# Patient Record
Sex: Female | Born: 1935
Health system: Southern US, Community
[De-identification: ages and names within clinical notes are randomized; demographics above are authoritative.]

## PROBLEM LIST (undated history)

## (undated) DIAGNOSIS — E7800 Pure hypercholesterolemia, unspecified: Secondary | ICD-10-CM

## (undated) DIAGNOSIS — N189 Chronic kidney disease, unspecified: Secondary | ICD-10-CM

## (undated) DIAGNOSIS — N289 Disorder of kidney and ureter, unspecified: Secondary | ICD-10-CM

## (undated) DIAGNOSIS — K219 Gastro-esophageal reflux disease without esophagitis: Secondary | ICD-10-CM

## (undated) DIAGNOSIS — E119 Type 2 diabetes mellitus without complications: Secondary | ICD-10-CM

## (undated) DIAGNOSIS — I10 Essential (primary) hypertension: Secondary | ICD-10-CM

## (undated) HISTORY — PX: ABDOMINAL HYSTERECTOMY: SHX81

## (undated) HISTORY — DX: Type 2 diabetes mellitus without complications: E11.9

## (undated) HISTORY — PX: CHOLECYSTECTOMY: SHX55

## (undated) HISTORY — DX: Gastro-esophageal reflux disease without esophagitis: K21.9

## (undated) HISTORY — DX: Disorder of kidney and ureter, unspecified: N28.9

## (undated) HISTORY — DX: Chronic kidney disease, unspecified: N18.9

## (undated) HISTORY — PX: CATARACT EXTRACTION: SUR2

## (undated) HISTORY — DX: Pure hypercholesterolemia, unspecified: E78.00

## (undated) HISTORY — DX: Essential (primary) hypertension: I10

---

## 1998-12-06 ENCOUNTER — Encounter: Admission: RE | Admit: 1998-12-06 | Discharge: 1999-03-06 | Payer: Self-pay | Admitting: Internal Medicine

## 2002-04-02 ENCOUNTER — Ambulatory Visit (HOSPITAL_COMMUNITY): Admission: RE | Admit: 2002-04-02 | Discharge: 2002-04-02 | Payer: Self-pay | Admitting: Internal Medicine

## 2002-04-02 ENCOUNTER — Encounter: Payer: Self-pay | Admitting: Internal Medicine

## 2002-05-13 ENCOUNTER — Encounter (HOSPITAL_COMMUNITY): Admission: RE | Admit: 2002-05-13 | Discharge: 2002-06-12 | Payer: Self-pay | Admitting: Internal Medicine

## 2002-05-13 ENCOUNTER — Encounter: Payer: Self-pay | Admitting: Internal Medicine

## 2002-05-14 ENCOUNTER — Encounter: Payer: Self-pay | Admitting: Internal Medicine

## 2003-01-23 HISTORY — PX: COLONOSCOPY: SHX174

## 2003-03-15 ENCOUNTER — Ambulatory Visit (HOSPITAL_COMMUNITY): Admission: RE | Admit: 2003-03-15 | Discharge: 2003-03-15 | Payer: Self-pay | Admitting: Ophthalmology

## 2003-08-23 ENCOUNTER — Ambulatory Visit (HOSPITAL_COMMUNITY): Admission: RE | Admit: 2003-08-23 | Discharge: 2003-08-23 | Payer: Self-pay | Admitting: Ophthalmology

## 2003-10-20 ENCOUNTER — Ambulatory Visit (HOSPITAL_COMMUNITY): Admission: RE | Admit: 2003-10-20 | Discharge: 2003-10-20 | Payer: Self-pay | Admitting: Internal Medicine

## 2004-05-22 ENCOUNTER — Ambulatory Visit (HOSPITAL_COMMUNITY): Admission: RE | Admit: 2004-05-22 | Discharge: 2004-05-22 | Payer: Self-pay | Admitting: Ophthalmology

## 2004-11-20 ENCOUNTER — Ambulatory Visit (HOSPITAL_COMMUNITY): Admission: RE | Admit: 2004-11-20 | Discharge: 2004-11-20 | Payer: Self-pay | Admitting: Internal Medicine

## 2005-11-02 ENCOUNTER — Ambulatory Visit (HOSPITAL_COMMUNITY): Admission: RE | Admit: 2005-11-02 | Discharge: 2005-11-02 | Payer: Self-pay | Admitting: Internal Medicine

## 2006-08-30 ENCOUNTER — Ambulatory Visit (HOSPITAL_COMMUNITY): Admission: RE | Admit: 2006-08-30 | Discharge: 2006-08-30 | Payer: Self-pay | Admitting: Internal Medicine

## 2006-09-30 ENCOUNTER — Ambulatory Visit (HOSPITAL_COMMUNITY): Admission: RE | Admit: 2006-09-30 | Discharge: 2006-09-30 | Payer: Self-pay | Admitting: Internal Medicine

## 2006-10-09 ENCOUNTER — Ambulatory Visit: Payer: Self-pay | Admitting: Internal Medicine

## 2006-10-22 ENCOUNTER — Ambulatory Visit: Payer: Self-pay | Admitting: Internal Medicine

## 2006-10-22 ENCOUNTER — Ambulatory Visit (HOSPITAL_COMMUNITY): Admission: RE | Admit: 2006-10-22 | Discharge: 2006-10-22 | Payer: Self-pay | Admitting: Internal Medicine

## 2006-10-22 HISTORY — PX: ESOPHAGOGASTRODUODENOSCOPY: SHX1529

## 2006-11-21 ENCOUNTER — Ambulatory Visit: Payer: Self-pay | Admitting: Internal Medicine

## 2007-02-17 ENCOUNTER — Ambulatory Visit (HOSPITAL_COMMUNITY): Admission: RE | Admit: 2007-02-17 | Discharge: 2007-02-17 | Payer: Self-pay | Admitting: Internal Medicine

## 2007-02-20 ENCOUNTER — Ambulatory Visit: Payer: Self-pay | Admitting: Internal Medicine

## 2007-10-06 DIAGNOSIS — R630 Anorexia: Secondary | ICD-10-CM

## 2007-10-06 DIAGNOSIS — K819 Cholecystitis, unspecified: Secondary | ICD-10-CM | POA: Insufficient documentation

## 2007-10-06 DIAGNOSIS — E119 Type 2 diabetes mellitus without complications: Secondary | ICD-10-CM | POA: Insufficient documentation

## 2007-10-06 DIAGNOSIS — R634 Abnormal weight loss: Secondary | ICD-10-CM

## 2007-10-06 DIAGNOSIS — B0229 Other postherpetic nervous system involvement: Secondary | ICD-10-CM | POA: Insufficient documentation

## 2007-10-06 DIAGNOSIS — R198 Other specified symptoms and signs involving the digestive system and abdomen: Secondary | ICD-10-CM | POA: Insufficient documentation

## 2007-10-06 DIAGNOSIS — N390 Urinary tract infection, site not specified: Secondary | ICD-10-CM | POA: Insufficient documentation

## 2007-10-06 DIAGNOSIS — E785 Hyperlipidemia, unspecified: Secondary | ICD-10-CM | POA: Insufficient documentation

## 2007-10-06 DIAGNOSIS — I10 Essential (primary) hypertension: Secondary | ICD-10-CM

## 2007-10-06 DIAGNOSIS — K5730 Diverticulosis of large intestine without perforation or abscess without bleeding: Secondary | ICD-10-CM | POA: Insufficient documentation

## 2007-10-06 DIAGNOSIS — R110 Nausea: Secondary | ICD-10-CM | POA: Insufficient documentation

## 2007-10-06 DIAGNOSIS — K644 Residual hemorrhoidal skin tags: Secondary | ICD-10-CM | POA: Insufficient documentation

## 2007-10-06 DIAGNOSIS — K449 Diaphragmatic hernia without obstruction or gangrene: Secondary | ICD-10-CM | POA: Insufficient documentation

## 2007-10-06 DIAGNOSIS — N289 Disorder of kidney and ureter, unspecified: Secondary | ICD-10-CM | POA: Insufficient documentation

## 2009-02-08 ENCOUNTER — Encounter: Payer: Self-pay | Admitting: Gastroenterology

## 2009-02-11 ENCOUNTER — Encounter: Payer: Self-pay | Admitting: Gastroenterology

## 2009-02-14 ENCOUNTER — Encounter: Payer: Self-pay | Admitting: Urgent Care

## 2009-10-18 ENCOUNTER — Ambulatory Visit (HOSPITAL_COMMUNITY): Admission: RE | Admit: 2009-10-18 | Discharge: 2009-10-18 | Payer: Self-pay | Admitting: Internal Medicine

## 2010-02-14 ENCOUNTER — Encounter: Payer: Self-pay | Admitting: Gastroenterology

## 2010-02-17 ENCOUNTER — Encounter (INDEPENDENT_AMBULATORY_CARE_PROVIDER_SITE_OTHER): Payer: Self-pay | Admitting: *Deleted

## 2010-02-21 NOTE — Medication Information (Signed)
Summary: Visual merchandiser   Imported By: Zeb Comfort 02/14/2009 09:06:45  _____________________________________________________________________  External Attachment:    Type:   Image     Comment:   External Document  Appended Document: RX Folder triplicate.  11 RFs sent on 02/09/09

## 2010-02-21 NOTE — Medication Information (Signed)
Summary: Visual merchandiser   Imported By: Zeb Comfort 02/08/2009 10:48:13  _____________________________________________________________________  External Attachment:    Type:   Image     Comment:   External Document  Appended Document: RX Folder - omeprazole    Prescriptions: OMEPRAZOLE 20 MG CPDR (OMEPRAZOLE) one by mouth daily 30 minutes before breakfast  #30 x 11   Entered and Authorized by:   Laureen Ochs. Bernarda Caffey   Signed by:   Laureen Ochs Latarshia Jersey PA-C on 02/09/2009   Method used:   [facility] to        [facility] [phone]* (retail)       [address]       [place], [place]  [address]       Ph: [phone] or [phone]       Fax: [phone]   RxID:   [id number]

## 2010-02-21 NOTE — Medication Information (Signed)
Summary: Visual merchandiser   Imported By: Zeb Comfort 02/11/2009 11:47:56  _____________________________________________________________________  External Attachment:    Type:   Image     Comment:   External Document  Appended Document: RX Folder duplicate

## 2010-02-22 ENCOUNTER — Encounter: Payer: Self-pay | Admitting: Gastroenterology

## 2010-02-22 ENCOUNTER — Telehealth (INDEPENDENT_AMBULATORY_CARE_PROVIDER_SITE_OTHER): Payer: Self-pay | Admitting: *Deleted

## 2010-02-23 NOTE — Medication Information (Signed)
Summary: OMEPRAZOLE 20MG   OMEPRAZOLE 20MG    Imported By: Hoy Morn 02/14/2010 14:52:05  _____________________________________________________________________  External Attachment:    Type:   Image     Comment:   External Document  Appended Document: OMEPRAZOLE 20MG  needs OV prior to more refills. see 1/24 note.   Appended Document: OMEPRAZOLE 20MG  pharmacy aware  Appended Document: OMEPRAZOLE 20MG  letter mailed to pt to call and set up OV

## 2010-02-23 NOTE — Letter (Signed)
Summary: Recall Office Visit  [facility]  [address]   [place], [place] [address]   Phone: [phone]  Fax: [phone]      February 17, 2010   [facility] [place], [place]  [address] October 09, 1933   Dear Ms. Antu,   According to our records, it is time for you to schedule a follow-up office visit with [place].   At your convenience, please call [phone] to schedule an office visit. If you have any questions, concerns, or feel that this letter is in error, we would appreciate your call.   Sincerely,    [facility] Ph: [phone]   Fax: [phone]

## 2010-03-01 NOTE — Medication Information (Signed)
Summary: OMEPRAZOLE DR 20MG   OMEPRAZOLE DR 20MG    Imported By: Hoy Morn 02/22/2010 09:52:00  _____________________________________________________________________  External Attachment:    Type:   Image     Comment:   External Document  Appended Document: OMEPRAZOLE DR 20MG     Prescriptions: OMEPRAZOLE 20 MG CPDR (OMEPRAZOLE) one by mouth daily 30 minutes before breakfast  #30 x 0   Entered and Authorized by:   Laban Emperor NP   Signed by:   Laban Emperor NP on 02/22/2010   Method used:   [facility] ...       [facility]  [address] [phone]* (retail)       [address]       [place], [place]  [address]       Ph: [phone]       Fax: [phone]   RxID:   [id number]

## 2010-03-01 NOTE — Progress Notes (Signed)
  Phone Note Call from Patient   Summary of Call: Pt called to make her appt for Rx refill- She is scheduled to see Vicente Males on 03/15/10 but wants to know can we give her enough Omerazole until then. [facility] [place]. Initial call taken by: Sherry Ruffing,  February 22, 2010 9:58 AM     Appended Document:  refill completed X 1. will be seen 2/22.   Appended Document:  pt aware

## 2010-03-15 ENCOUNTER — Encounter: Payer: Self-pay | Admitting: Gastroenterology

## 2010-03-15 ENCOUNTER — Ambulatory Visit (INDEPENDENT_AMBULATORY_CARE_PROVIDER_SITE_OTHER): Payer: Medicare Other | Admitting: Gastroenterology

## 2010-03-15 DIAGNOSIS — K219 Gastro-esophageal reflux disease without esophagitis: Secondary | ICD-10-CM

## 2010-03-21 NOTE — Assessment & Plan Note (Signed)
Summary: needs to be seen before getting meds   Vital Signs:  Patient profile:   77 year old female Height:      62.5 inches Weight:      140 pounds BMI:     25.29 Temp:     98 degrees F oral Pulse rate:   56 / minute BP sitting:   130 / 62  (left arm)  Vitals Entered By: Loney Loh LPN (February 22, [id number] 10:52 AM)  Visit Type:  Follow-up Visit Primary Care Provider:  Dr. Willey Blade   History of Present Illness: Presents in f/u for hx of GERD, epigastric pain. Taking Omeprazole daily; notices a difference if she doesn't take it. Otherwise, no breakthrough reflux. No dysphagia/odynophagia. No loss of appetite, no wt loss. No abdominal pain. BM daily, no melena, hematochezia. Doing well, just needs refill on medication.   Current Medications (verified): 1)  Glimepiride 2 Mg Tabs (Glimepiride) .... Take One Half Once Daily 2)  Lipitor 20 Mg Tabs (Atorvastatin Calcium) .... Once Daily 3)  Cozaar 100 Mg Tabs (Losartan Potassium) .... 1/2 Tablet Every Day. 4)  Metformin Hcl 500 Mg Tabs (Metformin Hcl) .... Take One Two Times A Day 5)  Atenolol-Chlorthalidone 50-25 Mg Tabs (Atenolol-Chlorthalidone) .... Once Daily 6)  Aspirin 81 Mg Tabs (Aspirin) .... Once Daily 7)  Omeprazole 20 Mg Cpdr (Omeprazole) .... One By Mouth Daily 30 Minutes Before Breakfast 8)  Omeprazole 20 Mg Cpdr (Omeprazole) .... Take 1 By Mouth 30 Minutes Before Breakfast  Allergies: 1)  ! Sulfa 2)  ! Codeine  Past History:  Past Medical History: GERD HTN DM Hypercholesterolemia  Review of Systems General:  Denies fever, chills, and anorexia. Eyes:  Denies blurring, irritation, and discharge. ENT:  Denies sore throat, hoarseness, and difficulty swallowing. CV:  Denies chest pains and syncope. Resp:  Denies dyspnea at rest and wheezing. GI:  See HPI. GU:  Denies urinary burning and urinary frequency. MS:  Denies joint pain / LOM, joint swelling, and joint stiffness. Derm:  Denies rash, itching, and dry  skin. Neuro:  Denies weakness and syncope. Psych:  Denies depression and anxiety.  Physical Exam  General:  Well developed, well nourished, no acute distress. Head:  Normocephalic and atraumatic. Abdomen:  +BS, soft, non-tender, non-distended. no HSM. no rebound or guarding. no masses noted.  Msk:  Symmetrical with no gross deformities. Normal posture. Pulses:  Normal pulses noted. Extremities:  No clubbing, cyanosis, edema or deformities noted. Neurologic:  Alert and  oriented x4;  grossly normal neurologically. Skin:  Intact without significant lesions or rashes. Psych:  Alert and cooperative. Normal mood and affect.   Impression & Recommendations:  Problem # 1:  GERD (ICD-3.45)  77 year old Caucasian female with hx of GERD, epigastric pain, doing well. Taking prilosec daily. No dysphagia or odynophagia. No lack of appetite.   Continue Prilosec daily F/U in 1 year or sooner if needed Refills sent to [facility]   Orders: Est. Patient Level II [id number]) Prescriptions: OMEPRAZOLE 20 MG CPDR (OMEPRAZOLE) take 1 by mouth 30 minutes before breakfast  #31 x 11   Entered and Authorized by:   Laban Emperor NP   Signed by:   Laban Emperor NP on 03/15/2010   Method used:   Faxed to ...       [facility]  [address] (retail)       [place], [place]  [address]  Ph: [phone]       Fax: [phone]   [id number]    Orders Added: 1)  Est. Patient Level II [id number]  Appended Document: needs to be seen before getting meds 1 YR F/U OPV IS IN THE COMPUTER

## 2010-06-06 NOTE — Assessment & Plan Note (Signed)
Tammy Dyer, Tammy Dyer               CHART#:  [id number]   DATE:  11/21/2006                       DOB:  1933-04-24   Follow up nausea, early satiety, epigastric pain, recent weight loss.  Last seen by me on 10/22/2006, at which time she underwent an EGD  revealing normal esophagus, small hiatal hernia, otherwise remainder  upper GI appeared normal. She had already experienced dramatic  improvement on less than two weeks worth of Prilosec 20 mg orally daily.  She is here with her daughter reporting that improvement has been  sustained. She has continued on Prilosec 20 mg orally daily. Nausea  __________ epigastric pain have subsided. She reportedly lost a good 16  or so pounds over the past couple of months and since she was seen by [place]  on 10/09/2006, not only has her weight has stabilized, but she has gained  1 pound. She has not had any melena or rectal bleeding. She did have a  colonoscopy with Dr. Shonna Chock back in September 2005 which revealed only  external hemorrhoids and left sided diverticula.   CURRENT MEDICATIONS:  See the updated list.   ALLERGIES:  SULFA and PENICILLIN.   This lady had previously undergone a CT of the abdomen and pelvis,  subsequent small bowel follow-through with questionable small bowel  loops. Ultimately, she had no significant findings or at least any  explanation for recent abdominal pain based on radiographic studies.   PHYSICAL EXAMINATION:  GENERAL:  She looks well.  VITAL SIGNS:  Weight 150, height 5 foot 4 inches, temperature 98.0,  blood pressure 128/70, pulse 64.  SKIN:  Warm and dry.  LUNGS:  Clear to auscultation.  CARDIAC:  Regular rate and rhythm with no murmurs, rubs, or gallops.  ABDOMEN:  Nondistended, positive bowel sounds, entirely soft, nontender,  without appreciable mass or organomegaly.   ASSESSMENT:  Epigastric pain, nausea, element of early satiety, much  improved on acid suppression therapy. She is diabetic and it is  certainly  possible that an element of diabetic gastroparesis is  __________.  However, she has experienced a near resolution of her  symptoms with acid suppression alone, of course gastroesophageal reflux  disease could be worsened with delay and gastric emptying.   RECOMMENDATIONS:  I have recommended that Ms. Cappucci simply stay on  Prilosec 20 mg orally daily for at least the next 3 months (prescription  given). We will plan on seeing this nice  lady back in three months to check her weight and see how she is doing  and we will go from there.       Bridgette Habermann, M.D.  Electronically Signed     RMR/MEDQ  D:  11/21/2006  T:  11/22/2006  Job:  [id number]   cc:   Paula Compton. Willey Blade, MD

## 2010-06-06 NOTE — Assessment & Plan Note (Signed)
NAMEKARNEISHA, Tammy Dyer               CHART#:  [id number]   DATE:  02/20/2007                       DOB:  [birth year]   Follow up epigastric pain, early satiety, weight loss, nausea, history  of diabetes.  Last seen 11/21/06.  Her epigastric pain is pretty much  gone.  She does have early satiety anorexic and some background symptoms  of nausea.  She certainly complains of early satiety.  Reflux symptoms  and abdominal pains have been abolished on Prilosec 20 mg orally daily.  She is a longstanding diabetic.  She is not having any diarrhea,  constipation, melena, or hematochezia.  She is having 1 formed bowel  movement daily.  She has lost 3 pounds since she was seen on 11/21/06.  A prior EGD demonstrated only a small hiatal hernia.  A colonoscopy in  2005 by Dr. Collins Scotland demonstrated some hemorrhoids and diverticulosis.   In 2008, she did have a small bowel follow-through which revealed  essentially a normal appearing small intestine.  A CT of the abdomen  back in August 2008 demonstrated some questionable small bowel full-  thickening proximally which led to a small bowel follow-through.   PAST MEDICAL HISTORY:  1. Diabetes.  2. Hypertension.  3. Chronic renal insufficiency.  4. Recurrent urinary tract infections.  5. Postherpetic neuralgia.   FAMILY HISTORY:  Negative for GI, neoplasia, inflammatory back disease.   CURRENT MEDICATIONS:  See updated list.   ALLERGIES:  Sulfa.  Codeine.   PHYSICAL EXAMINATION:  GENERAL:  She is accompanied by her daughter.  She appears comfortable.  VITAL SIGNS:  Weight 147, again down 3 pounds.  Height 5 feet 4 inches.  Temperature 97.8.  Blood pressure 108/70.  Pulse 68.  SKIN:  Warm and dry.  There is no jaundice.  HEENT:  No scleral icterus.  Conjunctivae are pink.  CHEST:  Lungs are clear to auscultation.  HEART:  Regular rate and rhythm without murmur, gallop, or rub.  BREAST EXAM:  Deferred.  ABDOMEN:  Nondistended.  Positive bowel  sounds.  No succussion splash.  Abdomen soft and nontender.  No appreciable mass or organomegaly.  EXTREMITIES:  No edema.   ASSESSMENT:  Early satiety anorexia along with weight loss in a  diabetic.  She has been fairly extensively evaluated up to this point.  Her reflux symptoms and abdominal pain component, her recent  symptomatology have resolved.  At this point, I would be more concerned  about the possibility of diabetic gastroparesis.  I told Tammy Dyer and  her daughter we ought to go ahead and get a baseline solid phase gastric  emptying study before an empiric trial of prokinetic therapy.  [name] wants to wait.  She says she has got a lot of things going at  this time.  So, at this point, I have recommended Tammy Dyer just  continue taking the Prilosec 20 mg orally daily and see how it goes.  It  her symptoms may settle down on their own but I would go with a GES as a  next step in her workup should she want to pursue things further.  At  any rate, I will plan to see Tammy Dyer back in 3 months.       Bridgette Habermann, M.D.  Electronically Signed     RMR/MEDQ  D:  02/20/2007  T:  02/20/2007  Job:  [id number]

## 2010-06-06 NOTE — Consult Note (Signed)
NAME:  Tammy Dyer, Tammy Dyer              ACCOUNT NO.:  [id number]   MEDICAL RECORD NO.:  [id number]          PATIENT TYPE:  AMB   LOCATION:  DAY                           FACILITY:  [facility]   PHYSICIAN:  R. Garfield Cornea, M.D. DATE OF BIRTH:  [birth year]   DATE OF CONSULTATION:  10/09/2006  DATE OF DISCHARGE:                                 CONSULTATION   REQUESTING PHYSICIAN:  Paula Compton. Willey Blade, MD.   REASON FOR CONSULTATION:  Epigastric pain, chronic nausea, weight loss,  anorexia, and early satiety.   HISTORY OF PRESENT ILLNESS:  Tammy Dyer is a 77 year old Caucasian  female.  She tells me that for a couple of months now she has had  frequent heartburn.  She has had worsening symptoms over the last 6  weeks including heartburn, chronic nausea where she feels sick solid  time.  She has lost 16 pounds in the last 2 months.  She tells me  everything I eat makes me feel sick.  She complains of epigastric  pain.  She has tried over-the-counter Tums and Pepcid AC both of which  seem to help a little.  She denies any dysphagia or odynophagia.  She  does complain of anorexia and early satiety.  She tells me everything  feels heavy on my stomach.  She is having normal bowel movements on a  daily basis.  Denies any rectal bleeding or melena.  Denies any diarrhea  or constipation.  She has had an abdominal/pelvic CT with contrast on  August 30, 2006.  She was found to have possible proximal small bowel  fold thickening versus incomplete distention.  She had a small bowel  follow through which was normal except for mild hypermotility.  She had  vascular calcifications with stable probable small splenic artery  aneurysm, and stable sigmoid diverticulosis.  She has a history of  chronic renal insufficiency with creatinine 1.5, BUN of 30 on August 21, 2006.  She had an ALT of 20, a hemoglobin A1c of 6.1.  She has not had a  complete set of LFT's.   PAST MEDICAL AND SURGICAL HISTORY:  Diabetes mellitus,  hypertension,  chronic renal insufficiency, urinary tract infection, currently on  Cipro, post hepatic neuralgia.  She had a colonoscopy by Dr. Laural Golden on  October 20, 2003 where she was found to have left colonic  diverticulosis as well as external hemorrhoids.  She has had a complete  hysterectomy, cholecystectomy in 1990 due to cholecystitis, and cataract  surgeries.   CURRENT MEDICATIONS:  1. Cipro 250 b.i.d. x7 days.  2. Glimepiride 2 mg daily.  3. Lipitor 12 grams daily.  4. Cozaar 50 mg daily.  5. Metformin HCl 500 mg b.i.d.  6. Atenolol Chlorthal 50/25 mg daily.  7. Aspirin 81 mg daily.   ALLERGIES:  SULFA and CODEINE.   FAMILY HISTORY:  There is no known family history of carcinoma or  chronic GI problems.  Mother deceased at 62 due to old age.  Father  deceased at age 68 secondary to CHF.  She has one sister with  Alzheimer's, two brothers have passed  away, with one brother living.   SOCIAL HISTORY:  Ms. Wolfenden is married.  She has two healthy children.  She is retired from a [employer].  She denies any tobacco, alcohol,  or drug use.   REVIEW OF SYSTEMS:  See HPI.  CONSTITUTIONAL:  Denies any fever or  chills.  CARDIOVASCULAR:  Denies any chest pain or palpitations,  __________ , shortness of breath, dyspnea, cough or hemoptysis.  GI:  See HPI.   PHYSICAL EXAMINATION:  VITAL SIGNS:  Weight 149 pounds, height 5 feet 3  inches.  Temperature 97.9, blood pressure 122/76, and pulse 60.  GENERAL:  Tammy Dyer is an elderly Caucasian female who is alert,  oriented, pleasant, and cooperative in no acute distress.  HEENT:  Skull is clear.  Sclerae are nonicteric.  Conjunctivae pink.  Oropharynx pink and moist without any lesions.  NECK:  Supple without __________  or thyromegaly.  CHEST:  Heart regular rhythm.  Normal S1-S2.  Without murmurs, clicks,  rubs or gallops.  ABDOMEN:  Positive bowel sounds x4.  No bruits auscultated.  Soft,  nondistended.  She does have  mild tenderness to the epigastrium and  around the umbilicus.  There is no rebound tenderness or guarding.  No  hepatosplenomegaly or mass.  EXTREMITIES:  Without clubbing or edema bilaterally.  SKIN:  Pink, warm and dry without any rash or jaundice.   IMPRESSION:  Tammy Dyer is a 77 year old Caucasian female with a 6-week  history of worsening heartburn, chronic nausea, early satiety, and  epigastric pain.  She is going to require further evaluation to rule out  peptic ulcer disease.  She has had a significant weight loss as well.  We also need to rule out occult malignancy in this lady given a 16-pound  weight loss in the last 2 months.  She does seem to have reflux and it  is possible that she could have diabetic gastroparesis;  however, this  does not explain her pain.   PLAN:  1. She is given a prescription for omeprazole 20 mg daily No. 31 with      2 refills as well as Prilosec 20 mg daily No. 10 samples.  2. An EGD with Dr. Gala Romney, in the near future.  I have discussed the      procedure including the risks and benefits; to      include but not limited to, bleeding, infection, perforation, and      drug reaction.  She agrees and an informed consent was obtained.   I would like to thank Dr. Willey Blade for allowing [place] to participate in the  care of Tammy Dyer.      Vickey Huger, N.P.      Bridgette Habermann, M.D.  Electronically Signed    KJ/MEDQ  D:  10/09/2006  T:  10/09/2006  Job:  [id number]   cc:   Paula Compton. Willey Blade, MD  Fax: [phone]

## 2010-06-06 NOTE — Op Note (Signed)
NAME:  Tammy Dyer, Tammy Dyer              ACCOUNT NO.:  [id number]   MEDICAL RECORD NO.:  [id number]          PATIENT TYPE:  AMB   LOCATION:  DAY                           FACILITY:  [facility]   PHYSICIAN:  R. Garfield Cornea, M.D. DATE OF BIRTH:  [birth year]   DATE OF PROCEDURE:  10/22/2006  DATE OF DISCHARGE:                               OPERATIVE REPORT   PROCEDURE:  Diagnostic EGD.   INDICATIONS FOR PROCEDURE:  A 77 year old lady with a six-week history  of worsening heartburn, chronic nausea, early satiety, epigastric pain.  She has history diabetes.  She was started on Cipro 20 mg orally daily  in the office on October 09, 2006.  This has been associated with a  marked dramatic improvement in the above-mentioned symptoms.  EGD is now  being done.  This approach has been discussed with the patient at  length.  Potential risks, benefits and alternatives have been reviewed,  questions answered.  She is agreeable.  Please see documentation in the  medical record.   PROCEDURE NOTE:  O2 saturation, blood pressure, pulse and respirations  were monitored throughout the entire procedure.   CONSCIOUS SEDATION:  Versed 3 mg IV, Demerol 75 mg IV in divided doses.   INSTRUMENT:  Pentax video chip system.   FINDINGS:  Examination of tubular esophagus revealed normal mucosa.  The  EG junction easily traversed.   Stomach:  Gastric cavity was emptied and insufflated well with air.  Thorough examination of the gastric mucosa including retroflexion to  view proximal stomach, esophagogastric junction demonstrated only a  small hiatal hernia.  Pylorus was patent and easily traversed.  Examination of the bulb and second portion revealed no abnormalities.   THERAPEUTIC/DIAGNOSTIC MANEUVERS PERFORMED:  None.   The patient tolerated the procedure well as reactive to endoscopy.   IMPRESSION:  Normal esophagus.  Small hiatal hernia, otherwise normal  stomach, D1 and D2.   This lady has experienced marked  dramatic improvement in the above-  mentioned symptoms with acid suppression therapy.   RECOMMENDATIONS:  Continue Prilosec 20 grams orally daily.  Will see  this nice lady back in one month.  Will check her weight, see how she is  doing and go from there.  Will decide about further evaluation depending  on how she does over the next four weeks.      Bridgette Habermann, M.D.  Electronically Signed     RMR/MEDQ  D:  10/22/2006  T:  10/22/2006  Job:  [id number]

## 2010-06-09 NOTE — Op Note (Signed)
Tammy Dyer, Tammy Dyer              ACCOUNT NO.:  [id number]   MEDICAL RECORD NO.:  [id number]          PATIENT TYPE:  AMB   LOCATION:  DAY                           FACILITY:  [facility]   PHYSICIAN:  Hildred Laser, M.D.    DATE OF BIRTH:  [birth year]   DATE OF PROCEDURE:  10/20/2003  DATE OF DISCHARGE:                                 OPERATIVE REPORT   PROCEDURE:  Total colonoscopy.   INDICATIONS:  [name] is a 77 year old Caucasian female who is here for  screening colonoscopy.  Family history is negative for colorectal carcinoma.  The procedure risks were reviewed with the patient and informed consent was  obtained.   PREMEDICATION:  Demerol 25 mg IV, Versed 6 mg IV in divided dose.   FINDINGS:  Procedure performed in endoscopy suite.  The patient's vital  signs and O2 saturation were monitored during procedure and remained stable.  The patient was placed in left lateral recumbent position and rectal  examination performed.  No abnormality noted on external or digital exam.  The Olympus video scope was placed in the rectum and advanced under vision  in the sigmoid colon.  Scattered small diverticula were noted in the sigmoid  and descending colon.  The scope was advanced to cecum, which was identified  by ileocecal valve and appendiceal orifice.  Pictures taken for the record.  As the scope was withdrawn, colonic mucosa was examined for the second time,  and there were no mucosal abnormalities.  Rectal mucosa similarly was  normal.  Scope was retroflexed to examine the anorectal junction, and small  hemorrhoids were noted below the dentate line.  The endoscope was  straightened and withdrawn.  The patient tolerated the procedure well.   FINAL DIAGNOSES:  Left colonic diverticulosis and small external  hemorrhoids, otherwise normal exam.   RECOMMENDATIONS:  1.  A high-fiber diet plus Citrucel one tablespoonful daily.  2.  She should continue yearly stool tests for occult blood and  consider      next screen in 10 years from now.     Naje   NR/MEDQ  D:  10/20/2003  T:  10/20/2003  Job:  [id number]   cc:   Paula Compton. Willey Blade, M.D.  [address]  [place]  [place] [address]  Fax: [phone]

## 2010-06-09 NOTE — Procedures (Signed)
   NAMEFELIZ, SIGNORILE                        ACCOUNT NO.:  [id number]   MEDICAL RECORD NO.:  [id number]                   PATIENT TYPE:  OUT   LOCATION:  RAD                                  FACILITY:  [facility]   PHYSICIAN:  Paula Compton. Willey Blade, M.D.                  DATE OF BIRTH:  01-10-1934   DATE OF PROCEDURE:  05/13/2002  DATE OF DISCHARGE:                                    STRESS TEST   INDICATION:  This test was performed because of chest pain.   DESCRIPTION OF PROCEDURE:  The patient exercised four minutes 59 seconds  (one minute 59 seconds into stage 2 of the Bruce protocol) sustaining a  maximal heart rate of 104 (68% of the age-predicted maximal heart rate) at a  work load of 7 METS and discontinued exercise due to fatigue.  There were no  symptoms of chest pain.  There were frequent PVCs.  There were no ST segment  changes diagnostic of ischemia.  There was a peak blood pressure response of  190/88.  The baseline EKG revealed normal sinus rhythm at 63 beats per  minute with nonspecific anterior T wave changes.  The heart rate response  was blunted by atenolol.   IMPRESSION:  Nondiagnostic exercise stress test.  Cardiolite images pending.                                               Paula Compton. Willey Blade, M.D.    ROF/MEDQ  D:  05/13/2002  T:  05/13/2002  Job:  [id number]

## 2010-09-22 ENCOUNTER — Ambulatory Visit (INDEPENDENT_AMBULATORY_CARE_PROVIDER_SITE_OTHER): Payer: Medicare Other | Admitting: Urology

## 2010-09-22 DIAGNOSIS — N952 Postmenopausal atrophic vaginitis: Secondary | ICD-10-CM

## 2010-09-22 DIAGNOSIS — N3020 Other chronic cystitis without hematuria: Secondary | ICD-10-CM

## 2010-09-22 DIAGNOSIS — N3946 Mixed incontinence: Secondary | ICD-10-CM

## 2011-03-06 ENCOUNTER — Encounter: Payer: Self-pay | Admitting: Internal Medicine

## 2011-03-28 ENCOUNTER — Ambulatory Visit: Payer: Medicare Other | Admitting: Gastroenterology

## 2011-03-30 ENCOUNTER — Encounter: Payer: Self-pay | Admitting: Internal Medicine

## 2011-04-02 ENCOUNTER — Encounter: Payer: Self-pay | Admitting: Gastroenterology

## 2011-04-02 ENCOUNTER — Ambulatory Visit (INDEPENDENT_AMBULATORY_CARE_PROVIDER_SITE_OTHER): Payer: Medicare Other | Admitting: Gastroenterology

## 2011-04-02 VITALS — BP 90/50 | HR 65 | Temp 97.6°F | Ht 62.0 in | Wt 141.0 lb

## 2011-04-02 DIAGNOSIS — K219 Gastro-esophageal reflux disease without esophagitis: Secondary | ICD-10-CM

## 2011-04-02 MED ORDER — OMEPRAZOLE 20 MG PO CPDR: 20.0000 mg | DELAYED_RELEASE_CAPSULE | Freq: Every day | ORAL | Status: DC

## 2011-04-02 NOTE — Progress Notes (Signed)
Referring Provider: No ref. provider found Primary Care Physician:  Asencion Noble, MD, MD  Chief Complaint  Patient presents with  . Follow-up  . Medication Refill    HPI:   Tammy Dyer returns today with her daughter for a year follow-up. Last seen by myself in Feb 2012. Hx of GERD, epigastric pain. Takes omeprazole daily. She is doing extremely well. Denies abdominal pain, reflux, dysphagia, N/V. Wt is stable from last year. No constipation or diarrhea. No rectal bleeding. Due for routine screening colonoscopy in 2015.    Past Medical History  Diagnosis Date  . GERD (gastroesophageal reflux disease)   . HTN (hypertension)   . DM (diabetes mellitus)   . Hypercholesterolemia   . CRI (chronic renal insufficiency)     Past Surgical History  Procedure Date  . Esophagogastroduodenoscopy 10/22/06    normal esophagus/small hiatal hernia  . Abdominal hysterectomy   . Cholecystectomy   . Cataract extraction   . Colonoscopy 2005    [facility]hman: diverticulosis, hemorrhoids, screening 2015    Current Outpatient Prescriptions  Medication Sig Dispense Refill  . atenolol-chlorthalidone (TENORETIC) 50-25 MG per tablet Take 1 tablet by mouth daily.       Marland Kitchen atorvastatin (LIPITOR) 20 MG tablet Take 20 mg by mouth daily.       Marland Kitchen glimepiride (AMARYL) 2 MG tablet Take 2 mg by mouth daily before breakfast.       . losartan (COZAAR) 100 MG tablet Take 100 mg by mouth daily.       . metFORMIN (GLUCOPHAGE) 500 MG tablet Take 500 mg by mouth 2 (two) times daily with a meal.       . omeprazole (PRILOSEC) 20 MG capsule Take 1 capsule (20 mg total) by mouth daily.  31 capsule  11  . sertraline (ZOLOFT) 100 MG tablet Take 100 mg by mouth daily.         Allergies as of 04/02/2011 - Review Complete 04/02/2011  Allergen Reaction Noted  . Codeine  03/15/2010  . Sulfonamide derivatives  03/15/2010    History   Social History  . Marital Status: Married    Spouse Name: N/A    Number of Children: N/A  .  Years of Education: N/A   Social History Main Topics  . Smoking status: Never Smoker   . Smokeless tobacco: None  . Alcohol Use: No  . Drug Use: No  . Sexually Active: None   Other Topics Concern  . None   Social History Narrative  . None    Review of Systems: Gen: Denies fever, chills, anorexia. Denies fatigue, weakness, weight loss.  CV: Denies chest pain, palpitations, syncope, peripheral edema, and claudication. Resp: Denies dyspnea at rest, cough, wheezing, coughing up blood, and pleurisy. GI: Denies vomiting blood, jaundice, and fecal incontinence.   Denies dysphagia or odynophagia. Derm: Denies rash, itching, dry skin Psych: Denies depression, anxiety, memory loss, confusion. No homicidal or suicidal ideation.  Heme: Denies bruising, bleeding, and enlarged lymph nodes.  Physical Exam: BP 90/50  Pulse 65  Temp(Src) 97.6 F (36.4 C) (Temporal)  Ht 5\' 2"  (1.575 m)  Wt 141 lb (63.957 kg)  BMI 25.79 kg/m2 General:   Alert and oriented. No distress noted. Pleasant and cooperative.  Head:  Normocephalic and atraumatic. Eyes:  Conjuctiva clear without scleral icterus. Mouth:  Oral mucosa pink and moist. Good dentition. No lesions. Neck:  Supple, without mass or thyromegaly. Heart:  S1, S2 present without murmurs, rubs, or gallops. Regular rate and rhythm. Abdomen:  +  BS, soft, non-tender and non-distended. No rebound or guarding. No HSM or masses noted. Msk:  Symmetrical without gross deformities. Normal posture. Extremities:  Without edema. Neurologic:  Alert and  oriented x4;  grossly normal neurologically. Skin:  Intact without significant lesions or rashes. Cervical Nodes:  No significant cervical adenopathy. Psych:  Alert and cooperative. Normal mood and affect.

## 2011-04-02 NOTE — Patient Instructions (Signed)
Take Prilosec each morning, 30 minutes before breakfast.   We will see you back in 2 years. At this time we will set up a colonoscopy. Please contact us if you have any questions or concerns in the meantime.

## 2011-04-03 ENCOUNTER — Encounter: Payer: Self-pay | Admitting: Gastroenterology

## 2011-04-03 NOTE — Assessment & Plan Note (Signed)
78 year old female with hx of chronic GERD and epigastric pain. Denies abdominal pain currently, continues to do well as long as she takes Prilosec daily. No dysphagia or other concerning features. Her wt is stable. At this time, I feel it would be appropriate to have her return in 2 years for routine follow-up. At that time, we will schedule a screening colonoscopy, as she will be due. However, I reviewed signs/symptoms to report should she have any issues in the interim. Prilosec refills given for 1 year. Return in 2 barring any complications or issues.

## 2011-04-04 NOTE — Progress Notes (Signed)
Faxed to PCP

## 2012-01-04 ENCOUNTER — Ambulatory Visit (INDEPENDENT_AMBULATORY_CARE_PROVIDER_SITE_OTHER): Payer: Medicare Other | Admitting: Urology

## 2012-01-04 DIAGNOSIS — N3941 Urge incontinence: Secondary | ICD-10-CM

## 2012-01-04 DIAGNOSIS — N3020 Other chronic cystitis without hematuria: Secondary | ICD-10-CM

## 2012-01-04 DIAGNOSIS — N952 Postmenopausal atrophic vaginitis: Secondary | ICD-10-CM

## 2012-03-27 ENCOUNTER — Other Ambulatory Visit: Payer: Self-pay | Admitting: Gastroenterology

## 2012-03-29 ENCOUNTER — Other Ambulatory Visit: Payer: Self-pay | Admitting: Gastroenterology

## 2013-01-09 ENCOUNTER — Ambulatory Visit: Payer: Medicare Other | Admitting: Urology

## 2013-03-12 ENCOUNTER — Other Ambulatory Visit: Payer: Self-pay | Admitting: Urgent Care

## 2014-03-22 ENCOUNTER — Other Ambulatory Visit: Payer: Self-pay

## 2014-03-23 MED ORDER — OMEPRAZOLE 20 MG PO CPDR: 20.0000 mg | DELAYED_RELEASE_CAPSULE | Freq: Every day | ORAL | Status: DC

## 2014-03-24 ENCOUNTER — Other Ambulatory Visit: Payer: Self-pay

## 2014-03-24 ENCOUNTER — Telehealth: Payer: Self-pay | Admitting: Internal Medicine

## 2014-03-24 MED ORDER — OMEPRAZOLE 20 MG PO CPDR: 20.0000 mg | DELAYED_RELEASE_CAPSULE | Freq: Every day | ORAL | Status: DC

## 2014-03-24 NOTE — Telephone Encounter (Signed)
PATIENT SENT IN REFILL FOR HER MEDICATION Saturday AND HAS NOT HEARD ANYTHING YET, DID NOT REMEMBER THE NAME, GOING TO RUN OUT BEFORE HER APPOINTMENT  PLEASE ADVISE

## 2014-03-24 NOTE — Telephone Encounter (Signed)
This refill was done yesterday and sent to the pharmacy. She needs to call her pharmacy.

## 2014-03-31 ENCOUNTER — Encounter: Payer: Self-pay | Admitting: Gastroenterology

## 2014-03-31 ENCOUNTER — Ambulatory Visit (INDEPENDENT_AMBULATORY_CARE_PROVIDER_SITE_OTHER): Payer: Medicare Other | Admitting: Gastroenterology

## 2014-03-31 VITALS — BP 148/65 | HR 57 | Temp 98.3°F | Ht 62.0 in | Wt 160.2 lb

## 2014-03-31 DIAGNOSIS — K219 Gastro-esophageal reflux disease without esophagitis: Secondary | ICD-10-CM

## 2014-03-31 NOTE — Assessment & Plan Note (Signed)
81 year old female with history of chronic GERD, doing quite well with once daily Prilosec. No concerning upper GI features. As of note, last colonoscopy in 2005 by Dr. Laural Golden, no polyps. No FH of colon cancer or any concerning lower GI symptoms. Although she is 6, she is still quite healthy, and it would not be unreasonable to pursue a routine final screening colonoscopy. She is hesitant about this, and I offered an ifobt or cologuard test. She would like to wait on all of this. She will contact us if she desires to pursue lower GI evaluation. Otherwise, continue Prilosec once daily and return in 1 year.

## 2014-03-31 NOTE — Progress Notes (Signed)
Referring Provider: Asencion Noble, MD Primary Care Physician:  Asencion Noble, MD  Chief Complaint  Patient presents with  . Follow-up    HPI:   Tammy Dyer is a 81 y.o. female presenting today with a history of  GERD. Due for routine screening colonoscopy now, as last was in 2005 by Dr. Laural Golden. Sometimes constipation, sometimes diarrhea. Doesn't eat right. Attributes to what she eats. Greasy, fried foods as culprits. No rectal bleeding. No abdominal pain. Had one episode of presumed diverticulitis in interim from last visit. Prescribed antibiotics with resolution. No imaging. No upper GI symptoms. Hesitant to pursue a colonoscopy. Would rather think about this.   Past Medical History  Diagnosis Date  . GERD (gastroesophageal reflux disease)   . HTN (hypertension)   . DM (diabetes mellitus)   . Hypercholesterolemia   . CRI (chronic renal insufficiency)     Past Surgical History  Procedure Laterality Date  . Esophagogastroduodenoscopy  10/22/06    normal esophagus/small hiatal hernia  . Abdominal hysterectomy    . Cholecystectomy    . Cataract extraction    . Colonoscopy  2005    Rehman: diverticulosis, hemorrhoids, screening 2015    Current Outpatient Prescriptions  Medication Sig Dispense Refill  . aspirin 81 MG tablet Take 81 mg by mouth daily.    Marland Kitchen atenolol-chlorthalidone (TENORETIC) 50-25 MG per tablet Take 1 tablet by mouth daily.     Marland Kitchen atorvastatin (LIPITOR) 20 MG tablet Take 20 mg by mouth daily.     Marland Kitchen glimepiride (AMARYL) 2 MG tablet Take 2 mg by mouth daily before breakfast.     . losartan (COZAAR) 100 MG tablet Take 100 mg by mouth daily.     . metFORMIN (GLUCOPHAGE) 500 MG tablet Take 500 mg by mouth 2 (two) times daily with a meal.     . omeprazole (PRILOSEC) 20 MG capsule Take 1 capsule (20 mg total) by mouth daily. 30 capsule 5  . omeprazole (PRILOSEC) 20 MG capsule Take 1 capsule (20 mg total) by mouth daily. 31 capsule 11  . pioglitazone (ACTOS) 15 MG  tablet Take 15 mg by mouth daily.  12  . sertraline (ZOLOFT) 100 MG tablet Take 100 mg by mouth daily.     Marland Kitchen trimethoprim (TRIMPEX) 100 MG tablet   5   No current facility-administered medications for this visit.    Allergies as of 03/31/2014 - Review Complete 03/31/2014  Allergen Reaction Noted  . Codeine  03/15/2010  . Sulfonamide derivatives  03/15/2010    Family History  Problem Relation Age of Onset  . Colon cancer Neg Hx     History   Social History  . Marital Status: Married    Spouse Name: N/A  . Number of Children: N/A  . Years of Education: N/A   Social History Main Topics  . Smoking status: Never Smoker   . Smokeless tobacco: Not on file  . Alcohol Use: No  . Drug Use: No  . Sexual Activity: Not on file   Other Topics Concern  . None   Social History Narrative    Review of Systems: Negative unless mentioned in HPI.    Physical Exam: BP 148/65 mmHg  Pulse 57  Temp(Src) 98.3 F (36.8 C) (Oral)  Ht 5\' 2"  (1.575 m)  Wt 160 lb 3.2 oz (72.666 kg)  BMI 29.29 kg/m2 General:   Alert and oriented. No distress noted. Pleasant and cooperative.  Head:  Normocephalic and atraumatic. Eyes:  Conjuctiva  clear without scleral icterus. Mouth:  Oral mucosa pink and moist.  Heart:  S1, S2 present without murmurs Abdomen:  +BS, soft, non-tender and non-distended. No rebound or guarding. No HSM or masses noted. Msk:  Symmetrical without gross deformities. Normal posture. Extremities:  Without edema. Neurologic:  Alert and  oriented x4;  grossly normal neurologically. Psych:  Alert and cooperative. Normal mood and affect.

## 2014-03-31 NOTE — Patient Instructions (Signed)
Continue Prilosec once each morning. We will see you back in 1 year.   Please let me know if you would like to proceed with a colonoscopy. Have a great summer!

## 2014-04-01 NOTE — Progress Notes (Signed)
cc'ed to pcp °

## 2015-03-08 DIAGNOSIS — H6990 Unspecified Eustachian tube disorder, unspecified ear: Secondary | ICD-10-CM | POA: Diagnosis not present

## 2015-03-08 DIAGNOSIS — Z6830 Body mass index (BMI) 30.0-30.9, adult: Secondary | ICD-10-CM | POA: Diagnosis not present

## 2015-03-10 ENCOUNTER — Encounter: Payer: Self-pay | Admitting: Internal Medicine

## 2015-04-15 ENCOUNTER — Other Ambulatory Visit: Payer: Self-pay

## 2015-04-15 MED ORDER — OMEPRAZOLE 20 MG PO CPDR: 20.0000 mg | DELAYED_RELEASE_CAPSULE | Freq: Every day | ORAL | Status: DC

## 2015-04-19 ENCOUNTER — Ambulatory Visit (INDEPENDENT_AMBULATORY_CARE_PROVIDER_SITE_OTHER): Payer: PPO | Admitting: Internal Medicine

## 2015-04-19 ENCOUNTER — Encounter: Payer: Self-pay | Admitting: Internal Medicine

## 2015-04-19 VITALS — BP 145/54 | HR 45 | Temp 97.5°F | Ht 61.0 in | Wt 162.8 lb

## 2015-04-19 DIAGNOSIS — K5900 Constipation, unspecified: Secondary | ICD-10-CM

## 2015-04-19 DIAGNOSIS — K219 Gastro-esophageal reflux disease without esophagitis: Secondary | ICD-10-CM

## 2015-04-19 MED ORDER — OMEPRAZOLE 20 MG PO CPDR: 20.0000 mg | DELAYED_RELEASE_CAPSULE | Freq: Every day | ORAL | Status: AC

## 2015-04-19 NOTE — Progress Notes (Signed)
Primary Care Physician:  Asencion Noble, MD Primary Gastroenterologist:  Dr. Gala Romney  Pre-Procedure History & Physical: HPI:  Tammy Dyer is a 82 y.o. female here for follow-up of GERD. She is doing very well has been well maintained on Prilosec 20 mg daily. What you symptoms or no dysphagia. No nausea or vomiting or melena.  The issue of colon cancer screening was brought up her last visit. After some discussion, she is not interested in pursuing any further screening. At this early not unreasonable.Last colonoscopy was done by Dr. Laural Golden in2005-negative. No lower GI tract symptoms. No family history of colon cancer.  Past Medical History  Diagnosis Date  . GERD (gastroesophageal reflux disease)   . HTN (hypertension)   . DM (diabetes mellitus) ([place])   . Hypercholesterolemia   . CRI (chronic renal insufficiency)     Past Surgical History  Procedure Laterality Date  . Esophagogastroduodenoscopy  10/22/06    normal esophagus/small hiatal hernia  . Abdominal hysterectomy    . Cholecystectomy    . Cataract extraction    . Colonoscopy  2005    [facility]: diverticulosis, hemorrhoids, screening 2015    Prior to Admission medications   Medication Sig Start Date End Date Taking? Authorizing Provider  aspirin 81 MG tablet Take 81 mg by mouth daily.   Yes Historical Provider, MD  atenolol-chlorthalidone (TENORETIC) 50-25 MG per tablet Take 1 tablet by mouth daily.  03/12/11  Yes Historical Provider, MD  atorvastatin (LIPITOR) 20 MG tablet Take 20 mg by mouth daily.  03/24/11  Yes Historical Provider, MD  glimepiride (AMARYL) 2 MG tablet Take 2 mg by mouth daily before breakfast.  03/12/11  Yes Historical Provider, MD  losartan (COZAAR) 100 MG tablet Take 100 mg by mouth daily. Reported on 04/19/2015 03/31/11  Yes Historical Provider, MD  omeprazole (PRILOSEC) 20 MG capsule Take 1 capsule (20 mg total) by mouth daily. 03/23/14  Yes Orvil Feil, NP  pioglitazone (ACTOS) 15 MG tablet Take 30 mg by mouth  daily.  03/04/14  Yes Historical Provider, MD  sertraline (ZOLOFT) 100 MG tablet Take 100 mg by mouth daily.  03/31/11  Yes Historical Provider, MD  trimethoprim (TRIMPEX) 100 MG tablet  03/27/14  Yes Historical Provider, MD  metFORMIN (GLUCOPHAGE) 500 MG tablet Take 500 mg by mouth 2 (two) times daily with a meal. Reported on 04/19/2015 03/12/11   Historical Provider, MD    Allergies as of 04/19/2015 - Review Complete 04/19/2015  Allergen Reaction Noted  . Codeine  03/15/2010  . Sulfonamide derivatives  03/15/2010    Family History  Problem Relation Age of Onset  . Colon cancer Neg Hx     Social History   Social History  . Marital Status: Married    Spouse Name: N/A  . Number of Children: N/A  . Years of Education: N/A   Occupational History  . Not on file.   Social History Main Topics  . Smoking status: Never Smoker   . Smokeless tobacco: Not on file  . Alcohol Use: No  . Drug Use: No  . Sexual Activity: Not on file   Other Topics Concern  . Not on file   Social History Narrative    Review of Systems: See HPI, otherwise negative ROS  Physical Exam: BP 145/54 mmHg  Pulse 45  Temp(Src) 97.5 F (36.4 C)  Ht 5\' 1"  (1.549 m)  Wt 162 lb 12.8 oz (73.846 kg)  BMI 30.78 kg/m2 General:   Alert,  Well-developed,  well-nourished, pleasant and cooperative in NAD  Impression:  Pleasant 82 year old lady with long-standing GERD well-controlled on Prilosec 20 mg daily. No alarm features at this time. We discussed long-term risk and benefits of aspiration therapy. I believe in the patient's case, the benefits far outweigh the risks. The issue of colorectal cancer screening has arisen recently.  No prior history of polyps - negative colonoscopy for polyps or neoplasia in  2005. No family history. No lower GI tract symptoms. She has declined occult blood testing and fecal oncogene testing as secondary screening maneuvers. At this point in time, I would not necessarily recommend further  attempts at screening including colonoscopy.  However, should she develop future lower GI tract symptoms, recommendations could change.  Recommendations:  GERD information provided  Refill Prilosec 20 mg daily (#30 with 11 refills)  Office visit in 1 year   Notice: This dictation was prepared with Dragon dictation along with smaller phrase technology. Any transcriptional errors that result from this process are unintentional and may not be corrected upon review.

## 2015-04-19 NOTE — Patient Instructions (Addendum)
GERD information provided  Refill Prilosec 20 mg daily (#30 with 11 refills)  Office visit in 1 year

## 2015-05-09 ENCOUNTER — Other Ambulatory Visit (HOSPITAL_COMMUNITY): Payer: Self-pay | Admitting: Internal Medicine

## 2015-05-09 DIAGNOSIS — M7122 Synovial cyst of popliteal space [Baker], left knee: Secondary | ICD-10-CM

## 2015-05-11 ENCOUNTER — Ambulatory Visit (HOSPITAL_COMMUNITY)
Admission: RE | Admit: 2015-05-11 | Discharge: 2015-05-11 | Disposition: A | Discharge status: Home / Self Care | Payer: PPO | Source: Ambulatory Visit | Attending: Internal Medicine | Admitting: Internal Medicine

## 2015-05-11 DIAGNOSIS — M7122 Synovial cyst of popliteal space [Baker], left knee: Secondary | ICD-10-CM

## 2015-05-11 DIAGNOSIS — M25562 Pain in left knee: Secondary | ICD-10-CM | POA: Diagnosis not present

## 2015-05-12 DIAGNOSIS — Z79899 Other long term (current) drug therapy: Secondary | ICD-10-CM | POA: Diagnosis not present

## 2015-05-12 DIAGNOSIS — E119 Type 2 diabetes mellitus without complications: Secondary | ICD-10-CM | POA: Diagnosis not present

## 2015-05-20 DIAGNOSIS — M25561 Pain in right knee: Secondary | ICD-10-CM | POA: Diagnosis not present

## 2015-05-20 DIAGNOSIS — N184 Chronic kidney disease, stage 4 (severe): Secondary | ICD-10-CM | POA: Diagnosis not present

## 2015-05-20 DIAGNOSIS — E1129 Type 2 diabetes mellitus with other diabetic kidney complication: Secondary | ICD-10-CM | POA: Diagnosis not present

## 2015-09-05 ENCOUNTER — Ambulatory Visit (INDEPENDENT_AMBULATORY_CARE_PROVIDER_SITE_OTHER): Payer: PPO | Admitting: Otolaryngology

## 2015-09-05 DIAGNOSIS — H903 Sensorineural hearing loss, bilateral: Secondary | ICD-10-CM

## 2015-09-05 DIAGNOSIS — H9222 Otorrhagia, left ear: Secondary | ICD-10-CM

## 2015-09-30 DIAGNOSIS — Z79899 Other long term (current) drug therapy: Secondary | ICD-10-CM | POA: Diagnosis not present

## 2015-09-30 DIAGNOSIS — E785 Hyperlipidemia, unspecified: Secondary | ICD-10-CM | POA: Diagnosis not present

## 2015-09-30 DIAGNOSIS — I10 Essential (primary) hypertension: Secondary | ICD-10-CM | POA: Diagnosis not present

## 2015-09-30 DIAGNOSIS — E119 Type 2 diabetes mellitus without complications: Secondary | ICD-10-CM | POA: Diagnosis not present

## 2015-09-30 DIAGNOSIS — N189 Chronic kidney disease, unspecified: Secondary | ICD-10-CM | POA: Diagnosis not present

## 2015-10-07 DIAGNOSIS — E1129 Type 2 diabetes mellitus with other diabetic kidney complication: Secondary | ICD-10-CM | POA: Diagnosis not present

## 2015-10-07 DIAGNOSIS — N184 Chronic kidney disease, stage 4 (severe): Secondary | ICD-10-CM | POA: Diagnosis not present

## 2015-10-07 DIAGNOSIS — Z23 Encounter for immunization: Secondary | ICD-10-CM | POA: Diagnosis not present

## 2015-10-07 DIAGNOSIS — I10 Essential (primary) hypertension: Secondary | ICD-10-CM | POA: Diagnosis not present

## 2015-10-12 ENCOUNTER — Other Ambulatory Visit (HOSPITAL_COMMUNITY): Payer: Self-pay | Admitting: Orthopaedic Surgery

## 2015-10-12 DIAGNOSIS — M25511 Pain in right shoulder: Secondary | ICD-10-CM | POA: Diagnosis not present

## 2015-10-12 DIAGNOSIS — M7541 Impingement syndrome of right shoulder: Secondary | ICD-10-CM | POA: Diagnosis not present

## 2015-10-18 ENCOUNTER — Ambulatory Visit (HOSPITAL_COMMUNITY)
Admission: RE | Admit: 2015-10-18 | Discharge: 2015-10-18 | Disposition: A | Discharge status: Home / Self Care | Payer: PPO | Source: Ambulatory Visit | Attending: Orthopaedic Surgery | Admitting: Orthopaedic Surgery

## 2015-10-18 DIAGNOSIS — S46111A Strain of muscle, fascia and tendon of long head of biceps, right arm, initial encounter: Secondary | ICD-10-CM | POA: Diagnosis not present

## 2015-10-18 DIAGNOSIS — X58XXXA Exposure to other specified factors, initial encounter: Secondary | ICD-10-CM | POA: Diagnosis not present

## 2015-10-18 DIAGNOSIS — M6289 Other specified disorders of muscle: Secondary | ICD-10-CM | POA: Diagnosis not present

## 2015-10-18 DIAGNOSIS — M75121 Complete rotator cuff tear or rupture of right shoulder, not specified as traumatic: Secondary | ICD-10-CM | POA: Diagnosis not present

## 2015-10-18 DIAGNOSIS — M7551 Bursitis of right shoulder: Secondary | ICD-10-CM | POA: Diagnosis not present

## 2015-10-18 DIAGNOSIS — M25511 Pain in right shoulder: Secondary | ICD-10-CM

## 2015-10-19 DIAGNOSIS — M25511 Pain in right shoulder: Secondary | ICD-10-CM | POA: Diagnosis not present

## 2015-10-19 DIAGNOSIS — M7541 Impingement syndrome of right shoulder: Secondary | ICD-10-CM | POA: Diagnosis not present

## 2015-11-08 ENCOUNTER — Ambulatory Visit: Admit: 2015-11-08 | Payer: Medicare Other | Admitting: Orthopaedic Surgery

## 2015-11-08 SURGERY — SHOULDER ARTHROSCOPY WITH OPEN ROTATOR CUFF REPAIR AND DISTAL CLAVICLE ACROMINECTOMY
Anesthesia: Choice | Site: Shoulder | Laterality: Right

## 2015-11-16 ENCOUNTER — Encounter (HOSPITAL_COMMUNITY): Payer: Self-pay | Admitting: Occupational Therapy

## 2015-11-16 ENCOUNTER — Ambulatory Visit (HOSPITAL_COMMUNITY): Payer: PPO | Attending: Orthopaedic Surgery | Admitting: Occupational Therapy

## 2015-11-16 DIAGNOSIS — G8929 Other chronic pain: Secondary | ICD-10-CM | POA: Diagnosis not present

## 2015-11-16 DIAGNOSIS — R29898 Other symptoms and signs involving the musculoskeletal system: Secondary | ICD-10-CM | POA: Diagnosis not present

## 2015-11-16 DIAGNOSIS — M25511 Pain in right shoulder: Secondary | ICD-10-CM | POA: Insufficient documentation

## 2015-11-16 NOTE — Patient Instructions (Signed)
Perform each exercise ____10____ reps. 2-3x days.   Protraction - STANDING  Start by holding a wand or cane at chest height.  Next, slowly push the wand outwards in front of your body so that your elbows become fully straightened. Then, return to the original position.     Shoulder FLEXION - STANDING - PALMS UP  In the standing position, hold a wand/cane with both arms, palms up on both sides. Raise up the wand/cane allowing your unaffected arm to perform most of the effort. Your affected arm should be partially relaxed.      Internal/External ROTATION - STANDING  In the standing position, hold a wand/cane with both hands keeping your elbows bent. Move your arms and wand/cane to one side.  Your affected arm should be partially relaxed while your unaffected arm performs most of the effort.       Shoulder ABDUCTION - STANDING  While holding a wand/cane palm face up on the injured side and palm face down on the uninjured side, slowly raise up your injured arm to the side.                     Horizontal Abduction/Adduction      Straight arms holding cane at shoulder height, bring cane to right, center, left. Repeat starting to left.   Copyright  [employer]. All rights reserved.

## 2015-11-17 NOTE — Therapy (Signed)
[place], [place], [address] Phone: [phone]   Fax:  [phone]  Occupational Therapy Evaluation  Patient Details  Name: Tammy Dyer MRN: [id number] Date of Birth: 09/20/1933 Referring Provider: Dr. Joni Fears  Encounter Date: 11/16/2015      OT End of Session - 11/17/15 1056    Visit Number 1   Number of Visits 12   Date for OT Re-Evaluation 12/30/15  mini reassessment 12/14/15   Authorization Type [employer]   Authorization Time Period Before 10th visit   Authorization - Visit Number 1   Authorization - Number of Visits 10   OT Start Time 1520   OT Stop Time 1550   OT Time Calculation (min) 30 min   Activity Tolerance Patient tolerated treatment well   Behavior During Therapy [facility] for tasks assessed/performed      Past Medical History:  Diagnosis Date  . CRI (chronic renal insufficiency)   . DM (diabetes mellitus) ([place])   . GERD (gastroesophageal reflux disease)   . HTN (hypertension)   . Hypercholesterolemia     Past Surgical History:  Procedure Laterality Date  . ABDOMINAL HYSTERECTOMY    . CATARACT EXTRACTION    . CHOLECYSTECTOMY    . COLONOSCOPY  2005   Rehman: diverticulosis, hemorrhoids, screening 2015  . ESOPHAGOGASTRODUODENOSCOPY  10/22/06   normal esophagus/small hiatal hernia    There were no vitals filed for this visit.      Subjective Assessment - 11/17/15 1053    Subjective  S: Sometimes I have pain and sometimes I don't.    Pertinent History Pt is an 82 y/o female presenting with right RCT and impingement present since at least July, possibly longer. Pt was referred for occupational thearpy evaluation and treatment by Dr. Joni Fears.   Special Tests --   Patient Stated Goals To be able to use my right arm with less pain.    Currently in Pain? No/denies           [facility] OT Assessment - 11/16/15 1523      Assessment   Diagnosis Right Rotator  Cuff Tear and impingement   Referring Provider Dr. Joni Fears   Onset Date --  July 2017 or before   Prior Therapy None     Precautions   Precautions None     Restrictions   Weight Bearing Restrictions No     Balance Screen   Has the patient fallen in the past 6 months Yes   How many times? 1   Has the patient had a decrease in activity level because of a fear of falling?  No   Is the patient reluctant to leave their home because of a fear of falling?  No     Home  Environment   Family/patient expects to be discharged to: Private residence     Prior Function   Level of Independence Independent with basic ADLs   Vocation Retired   Leisure reading     ADL   ADL comments Pt is having difficulty reaching up into cabinets, grooming-fixing hair, reaching across body, lifting weighted objects     Written Expression   Dominant Hand Right     Cognition   Overall Cognitive Status Within Functional Limits for tasks assessed     ROM / Strength   AROM / PROM / Strength AROM;Strength;PROM     Palpation   Palpation comment Moderate fascial restrictions in right upper arm and trapezius  regions     AROM   Overall AROM Comments Assessed seated, er/IR adducted   AROM Assessment Site Shoulder   Right/Left Shoulder Right   Right Shoulder Flexion 140 Degrees   Right Shoulder ABduction 160 Degrees   Right Shoulder Internal Rotation 90 Degrees   Right Shoulder External Rotation 58 Degrees     PROM   Overall PROM Comments Assessed supine, er/IR adducted   PROM Assessment Site Shoulder   Right/Left Shoulder Right   Right Shoulder Flexion 155 Degrees   Right Shoulder ABduction 160 Degrees   Right Shoulder Internal Rotation 90 Degrees   Right Shoulder External Rotation 79 Degrees     Strength   Overall Strength Comments Assessed seated, er/IR adducted   Strength Assessment Site Shoulder   Right/Left Shoulder Right   Right Shoulder Flexion 3/5   Right Shoulder ABduction 3+/5    Right Shoulder Internal Rotation 4+/5   Right Shoulder External Rotation 3/5                         OT Education - 11/17/15 1055    Education provided Yes   Education Details AA/ROM exercises   Person(s) Educated Patient   Methods Explanation;Demonstration;Handout   Comprehension Verbalized understanding;Returned demonstration;Verbal cues required          OT Short Term Goals - 11/17/15 1100      OT SHORT TERM GOAL #1   Title Pt will be educated on HEP.    Time 6   Period Weeks   Status New     OT SHORT TERM GOAL #2   Title Pt will return to highest level of functioning and independence with ADL tasks using RUE as dominant.    Time 6   Period Weeks   Status New     OT SHORT TERM GOAL #3   Title Pt will decrease RUE fascial restrictions from mod to minimal amounts to improve mobility of RUE during reaching tasks.    Time 6   Period Weeks   Status New     OT SHORT TERM GOAL #4   Title Pt will decrease RUE pain to 3/10 or less to increase ability to perform ADL tasks using RUE as dominant.    Time 6   Period Weeks   Status New     OT SHORT TERM GOAL #5   Title Pt will improve A/ROM to WNL in RUE to increase ability to complete overhead reaching tasks.    Time 6   Period Weeks   Status New     Additional Short Term Goals   Additional Short Term Goals Yes     OT SHORT TERM GOAL #6   Title Pt will increase RUE strength to 4/5 to improve ability to lift weighted objects during ADL task completion.    Time 6   Period Weeks   Status New                  Plan - 11/17/15 1057    Clinical Impression Statement A: Pt is an 82 y/o female presenting with right RCT and impingement causing increased pain and fascial restrictions, decreased ROM and strength limiting functional use of RUE during daily tasks. Pt provided with AA/ROM HEP and educated on, pt returned demonstration with minimal verbal cuing for form. Pt requests 1x/week for treatment  as she relies on other family for transportation.    Rehab Potential Good   OT Frequency 1x /  week   OT Duration 6 weeks   OT Treatment/Interventions Self-care/ADL training;Therapeutic exercise;Patient/family education;Manual Therapy;Therapeutic activities;Cryotherapy;Electrical Stimulation;Passive range of motion;Moist Heat   Plan P: Pt will benefit from skilled OT services to decrease pain and fascial restrictions, increase ROM, strength and improve functional use of RUE during daily tasks. Treatment plan: myofascial release, manual stretching, P/ROM, AA/ROM, A/ROM, scapular stability and strengthening, general RUE strengthening   OT Home Exercise Plan 10/25: AA/ROM   Consulted and Agree with Plan of Care Patient      Patient will benefit from skilled therapeutic intervention in order to improve the following deficits and impairments:  Impaired flexibility, Decreased strength, Decreased range of motion, Impaired UE functional use, Increased fascial restricitons, Pain  Visit Diagnosis: Chronic right shoulder pain  Other symptoms and signs involving the musculoskeletal system      G-Codes - Nov 23, 2015 1122    Functional Assessment Tool Used clinical judgement   Functional Limitation Carrying, moving and handling objects   Carrying, Moving and Handling Objects Current Status (C9470) At least 40 percent but less than 60 percent impaired, limited or restricted   Carrying, Moving and Handling Objects Goal Status (J6283) At least 20 percent but less than 40 percent impaired, limited or restricted      Problem List Patient Active Problem List   Diagnosis Date Noted  . GERD 03/15/2010  . POSTHERPETIC NEURALGIA 10/06/2007  . DIABETES MELLITUS 10/06/2007  . HYPERLIPIDEMIA 10/06/2007  . HYPERTENSION 10/06/2007  . EXTERNAL HEMORRHOIDS 10/06/2007  . HIATAL HERNIA 10/06/2007  . DIVERTICULOSIS, COLON 10/06/2007  . CHOLECYSTITIS 10/06/2007  . RENAL DISEASE, CHRONIC 10/06/2007  . UTI'S,  RECURRENT 10/06/2007  . ANOREXIA 10/06/2007  . WEIGHT LOSS 10/06/2007  . NAUSEA, CHRONIC 10/06/2007  . EARLY SATIETY 10/06/2007   Guadelupe Sabin, OTR/L  [phone] 11/23/15, 11:22 AM  [place], [place], [address] Phone: [phone]   Fax:  [phone]  Name: Tammy Dyer MRN: [id number] Date of Birth: 15-Jul-1933

## 2015-11-30 ENCOUNTER — Ambulatory Visit (HOSPITAL_COMMUNITY): Payer: PPO | Attending: Orthopaedic Surgery

## 2015-11-30 ENCOUNTER — Encounter (HOSPITAL_COMMUNITY): Payer: Self-pay

## 2015-11-30 DIAGNOSIS — M25511 Pain in right shoulder: Secondary | ICD-10-CM | POA: Diagnosis not present

## 2015-11-30 DIAGNOSIS — G8929 Other chronic pain: Secondary | ICD-10-CM | POA: Diagnosis not present

## 2015-11-30 DIAGNOSIS — R29898 Other symptoms and signs involving the musculoskeletal system: Secondary | ICD-10-CM | POA: Diagnosis not present

## 2015-11-30 NOTE — Patient Instructions (Signed)
1) Shoulder Protraction    Begin with elbows by your side, slowly "punch" straight out in front of you.      2) Shoulder Flexion  Supine:     Standing:         Begin with arms at your side with thumbs pointed up, slowly raise both arms up and forward towards overhead.    3) Horizontal abduction/adduction  Supine:   Standing:           Begin with arms straight out in front of you, bring out to the side in at "T" shape. Keep arms straight entire time.    4) Internal & External Rotation    *No band* -Stand with elbows at the side and elbows bent 90 degrees. Move your forearms away from your body, then bring back inward toward the body.     5) Shoulder Abduction  Supine:     Standing:       Lying on your back begin with your arms flat on the table next to your side. Slowly move your arms out to the side so that they go overhead, in a jumping jack or snow angel movement.       Repeat all exercises 10-15 times, 1-2 times per day.

## 2015-11-30 NOTE — Therapy (Signed)
[place], [place], [address] Phone: [phone]   Fax:  [phone]  Occupational Therapy Treatment  Patient Details  Name: Tammy Dyer MRN: [id number] Date of Birth: 07-21-1933 Referring Provider: Dr. Joni Fears  Encounter Date: 11/30/2015      OT End of Session - 11/30/15 1553    Visit Number 2   Number of Visits 12   Date for OT Re-Evaluation 12/30/15  mini reassessment 12/14/15   Authorization Type [employer]   Authorization Time Period Before 10th visit   Authorization - Visit Number 2   Authorization - Number of Visits 10   OT Start Time 1518   OT Stop Time 1600   OT Time Calculation (min) 42 min   Activity Tolerance Patient tolerated treatment well   Behavior During Therapy [facility] for tasks assessed/performed      Past Medical History:  Diagnosis Date  . CRI (chronic renal insufficiency)   . DM (diabetes mellitus) ([place])   . GERD (gastroesophageal reflux disease)   . HTN (hypertension)   . Hypercholesterolemia     Past Surgical History:  Procedure Laterality Date  . ABDOMINAL HYSTERECTOMY    . CATARACT EXTRACTION    . CHOLECYSTECTOMY    . COLONOSCOPY  2005   Rehman: diverticulosis, hemorrhoids, screening 2015  . ESOPHAGOGASTRODUODENOSCOPY  10/22/06   normal esophagus/small hiatal hernia    There were no vitals filed for this visit.      Subjective Assessment - 11/30/15 1539    Subjective  S: I have difficulty keeping my arms straight during the exercises.    Currently in Pain? Yes   Pain Score 4    Pain Location Shoulder   Pain Orientation Right   Pain Descriptors / Indicators Sore   Pain Type Chronic pain   Pain Radiating Towards N/A   Pain Onset In the past 7 days   Pain Frequency Occasional   Aggravating Factors  in the morning it it worst   Pain Relieving Factors movement and stretches   Effect of Pain on Daily Activities Min effect   Multiple Pain Sites  No            [facility] OT Assessment - 11/30/15 1543      Assessment   Diagnosis Right Rotator Cuff Tear and impingement     Precautions   Precautions None                  OT Treatments/Exercises (OP) - 11/30/15 1543      Exercises   Exercises Shoulder     Shoulder Exercises: Supine   Protraction PROM;AROM;10 reps   Horizontal ABduction PROM;AROM;10 reps   External Rotation PROM;AROM;10 reps   Internal Rotation PROM;AROM;10 reps   Flexion PROM;AROM;10 reps   ABduction PROM;AROM;10 reps     Shoulder Exercises: Standing   Protraction AROM;10 reps   Horizontal ABduction AROM;10 reps   External Rotation AROM;10 reps   Internal Rotation AROM;10 reps   Flexion AROM;10 reps   ABduction AROM;10 reps     Shoulder Exercises: ROM/Strengthening   Wall Wash 1'     Manual Therapy   Manual Therapy Myofascial release   Manual therapy comments Manual therapy completed prior to exercises   Myofascial Release Myofascial release and manual stretching completed to right upper arm, trapezius, and scapularis region to decrease fascial restrctions and increase joint mobility in a pain free zone.  OT Education - 11/30/15 1549    Education provided Yes   Education Details Pt was given a print out of OT evaluation. Reviewed plan of care and goals. Updated HEP to include A/ROM exercises.    Person(s) Educated Patient   Methods Explanation;Demonstration   Comprehension Verbalized understanding;Returned demonstration          OT Short Term Goals - 11/30/15 1554      OT SHORT TERM GOAL #1   Title Pt will be educated on HEP.    Time 6   Period Weeks   Status On-going     OT SHORT TERM GOAL #2   Title Pt will return to highest level of functioning and independence with ADL tasks using RUE as dominant.    Time 6   Period Weeks   Status On-going     OT SHORT TERM GOAL #3   Title Pt will decrease RUE fascial restrictions from mod to minimal amounts  to improve mobility of RUE during reaching tasks.    Time 6   Period Weeks   Status On-going     OT SHORT TERM GOAL #4   Title Pt will decrease RUE pain to 3/10 or less to increase ability to perform ADL tasks using RUE as dominant.    Time 6   Period Weeks   Status On-going     OT SHORT TERM GOAL #5   Title Pt will improve A/ROM to WNL in RUE to increase ability to complete overhead reaching tasks.    Time 6   Period Weeks   Status On-going     OT SHORT TERM GOAL #6   Title Pt will increase RUE strength to 4/5 to improve ability to lift weighted objects during ADL task completion.    Time 6   Period Weeks   Status On-going                  Plan - 11/30/15 1604    Clinical Impression Statement A: Initiated myofascial release, manual stretching, and A/ROM supine and standing. patient did well with A/ROM exercises and was given an updated HEP to complete at home. VC for form and technique were needed. Patient has functional/full P/ROM during manual stretching.    Plan P: Follow up on updated HEP and updated as needed. Add UBE bike and scapular theraband exercises.    OT Home Exercise Plan 10/25: AA/ROM 11/8: A/ROM exercises. Pt can now stop AA/ROM.       Patient will benefit from skilled therapeutic intervention in order to improve the following deficits and impairments:  Impaired flexibility, Decreased strength, Decreased range of motion, Impaired UE functional use, Increased fascial restricitons, Pain  Visit Diagnosis: Chronic right shoulder pain  Other symptoms and signs involving the musculoskeletal system    Problem List Patient Active Problem List   Diagnosis Date Noted  . GERD 03/15/2010  . POSTHERPETIC NEURALGIA 10/06/2007  . DIABETES MELLITUS 10/06/2007  . HYPERLIPIDEMIA 10/06/2007  . HYPERTENSION 10/06/2007  . EXTERNAL HEMORRHOIDS 10/06/2007  . HIATAL HERNIA 10/06/2007  . DIVERTICULOSIS, COLON 10/06/2007  . CHOLECYSTITIS 10/06/2007  . RENAL  DISEASE, CHRONIC 10/06/2007  . UTI'S, RECURRENT 10/06/2007  . ANOREXIA 10/06/2007  . WEIGHT LOSS 10/06/2007  . NAUSEA, CHRONIC 10/06/2007  . EARLY SATIETY 10/06/2007   Ailene Ravel, OTR/L,CBIS  [phone]  11/30/2015, 4:06 PM  [place] [address] [place], [place], [address] Phone: [phone]   Fax:  [phone]  Name: Tammy Swalley  Dyer MRN: [id number] Date of Birth: [birth year]

## 2015-12-07 ENCOUNTER — Ambulatory Visit (HOSPITAL_COMMUNITY): Payer: PPO | Admitting: Specialist

## 2015-12-07 DIAGNOSIS — M25511 Pain in right shoulder: Principal | ICD-10-CM

## 2015-12-07 DIAGNOSIS — R29898 Other symptoms and signs involving the musculoskeletal system: Secondary | ICD-10-CM

## 2015-12-07 DIAGNOSIS — G8929 Other chronic pain: Secondary | ICD-10-CM

## 2015-12-07 NOTE — Therapy (Signed)
[place], [place], [address] Phone: [phone]   Fax:  [phone]  Occupational Therapy Treatment  Patient Details  Name: Tammy Dyer MRN: [id number] Date of Birth: 06/24/1933 Referring Provider: Dr. Joni Fears  Encounter Date: 12/07/2015      OT End of Session - 12/07/15 1558    Visit Number 3   Number of Visits 12   Date for OT Re-Evaluation 12/30/15   Authorization Type [employer]   Authorization Time Period Before 10th visit   Authorization - Visit Number 3   Authorization - Number of Visits 10   OT Start Time 1520   OT Stop Time 1600   OT Time Calculation (min) 40 min      Past Medical History:  Diagnosis Date  . CRI (chronic renal insufficiency)   . DM (diabetes mellitus) ([place])   . GERD (gastroesophageal reflux disease)   . HTN (hypertension)   . Hypercholesterolemia     Past Surgical History:  Procedure Laterality Date  . ABDOMINAL HYSTERECTOMY    . CATARACT EXTRACTION    . CHOLECYSTECTOMY    . COLONOSCOPY  2005   Rehman: diverticulosis, hemorrhoids, screening 2015  . ESOPHAGOGASTRODUODENOSCOPY  10/22/06   normal esophagus/small hiatal hernia    There were no vitals filed for this visit.      Subjective Assessment - 12/07/15 1528    Subjective  S;  I swept and cleaned some today and now its a bit sore.    Currently in Pain? Yes   Pain Score 4    Pain Location Shoulder   Pain Orientation Right   Pain Descriptors / Indicators Sore   Pain Type Chronic pain   Pain Onset More than a month ago   Pain Frequency Occasional   Aggravating Factors  with use   Pain Relieving Factors rest   Effect of Pain on Daily Activities minimal            [facility] OT Assessment - 12/07/15 0001      Assessment   Diagnosis Right Rotator Cuff Tear and impingement   Prior Therapy None     Precautions   Precautions None                  OT Treatments/Exercises  (OP) - 12/07/15 0001      Exercises   Exercises Shoulder     Shoulder Exercises: Supine   Protraction PROM;AAROM;10 reps   Horizontal ABduction PROM;AAROM;10 reps   External Rotation PROM;AAROM;10 reps   Internal Rotation PROM;AAROM;10 reps   Flexion PROM;AAROM;10 reps   ABduction PROM;AAROM;10 reps     Shoulder Exercises: Seated   Elevation AROM;15 reps   Extension AROM;15 reps   Retraction AROM;15 reps   Row AROM;10 reps     Shoulder Exercises: ROM/Strengthening   UBE (Upper Arm Bike) 3' forward and 3' reverse 1.0   X to V Arms 10 times with min verbal guidance   Proximal Shoulder Strengthening, Supine 10 times with mod verbal guidance   Proximal Shoulder Strengthening, Seated 10 times with mod verbal guidance     Manual Therapy   Manual Therapy Myofascial release   Manual therapy comments Manual therapy completed prior to exercises   Myofascial Release Myofascial release and manual stretching completed to right upper arm, trapezius, and scapularis region to decrease fascial restrctions and increase joint mobility in a pain free zone.  OT Short Term Goals - 11/30/15 1554      OT SHORT TERM GOAL #1   Title Pt will be educated on HEP.    Time 6   Period Weeks   Status On-going     OT SHORT TERM GOAL #2   Title Pt will return to highest level of functioning and independence with ADL tasks using RUE as dominant.    Time 6   Period Weeks   Status On-going     OT SHORT TERM GOAL #3   Title Pt will decrease RUE fascial restrictions from mod to minimal amounts to improve mobility of RUE during reaching tasks.    Time 6   Period Weeks   Status On-going     OT SHORT TERM GOAL #4   Title Pt will decrease RUE pain to 3/10 or less to increase ability to perform ADL tasks using RUE as dominant.    Time 6   Period Weeks   Status On-going     OT SHORT TERM GOAL #5   Title Pt will improve A/ROM to WNL in RUE to increase ability to complete  overhead reaching tasks.    Time 6   Period Weeks   Status On-going     OT SHORT TERM GOAL #6   Title Pt will increase RUE strength to 4/5 to improve ability to lift weighted objects during ADL task completion.    Time 6   Period Weeks   Status On-going                  Plan - 12/07/15 1558    Clinical Impression Statement A:  patient experiencing increased pain and difficulty with A/ROM in supine this date, therefore completed AA/ROM instead.  Added proximal shoulder strengthening in supine and seated and UBE for improved scapular stability.     Plan P: Resume A/ROM as pain level allows and add scapular theraband.        Patient will benefit from skilled therapeutic intervention in order to improve the following deficits and impairments:     Visit Diagnosis: Chronic right shoulder pain  Other symptoms and signs involving the musculoskeletal system    Problem List Patient Active Problem List   Diagnosis Date Noted  . GERD 03/15/2010  . POSTHERPETIC NEURALGIA 10/06/2007  . DIABETES MELLITUS 10/06/2007  . HYPERLIPIDEMIA 10/06/2007  . HYPERTENSION 10/06/2007  . EXTERNAL HEMORRHOIDS 10/06/2007  . HIATAL HERNIA 10/06/2007  . DIVERTICULOSIS, COLON 10/06/2007  . CHOLECYSTITIS 10/06/2007  . RENAL DISEASE, CHRONIC 10/06/2007  . UTI'S, RECURRENT 10/06/2007  . ANOREXIA 10/06/2007  . WEIGHT LOSS 10/06/2007  . NAUSEA, CHRONIC 10/06/2007  . EARLY SATIETY 10/06/2007    Vangie Bicker, [place], OTR/L [phone]  12/07/2015, 4:01 PM  [place], [place], [address] Phone: [phone]   Fax:  [phone]  Name: KAYCI BELLEVILLE MRN: [id number] Date of Birth: 12/27/1933

## 2015-12-14 ENCOUNTER — Ambulatory Visit (HOSPITAL_COMMUNITY): Payer: PPO

## 2015-12-21 ENCOUNTER — Ambulatory Visit (HOSPITAL_COMMUNITY): Payer: PPO | Admitting: Specialist

## 2015-12-21 DIAGNOSIS — M25511 Pain in right shoulder: Secondary | ICD-10-CM | POA: Diagnosis not present

## 2015-12-21 DIAGNOSIS — G8929 Other chronic pain: Secondary | ICD-10-CM

## 2015-12-21 DIAGNOSIS — R29898 Other symptoms and signs involving the musculoskeletal system: Secondary | ICD-10-CM

## 2015-12-21 NOTE — Patient Instructions (Signed)
(  Home) Extension: Isometric / Bilateral Arm Retraction - Sitting   Facing anchor, hold hands and elbow at shoulder height, with elbow bent.  Pull arms back to squeeze shoulder blades together. Repeat 10-15 times.  Copyright  VHI. All rights reserved.   (Home) Retraction: Row - Bilateral (Anchor)   Facing anchor, arms reaching forward, pull hands toward stomach, keeping elbows bent and at your sides and pinching shoulder blades together. Repeat 10-15 times.  Copyright  VHI. All rights reserved.   (Clinic) Extension / Flexion (Assist)   Face anchor, pull arms back, keeping elbow straight, and squeze shoulder blades together. Repeat 10-15 times.   Copyright  VHI. All rights reserved.  Strengthening: Resisted External Rotation    Hold tubing in right hand, elbow at side and forearm across body. Rotate forearm out. Repeat ____ times per set. Do ____ sets per session. Do ____ sessions per day.  http://orth.exer.us/829   Copyright  VHI. All rights reserved.  Strengthening: Resisted Internal Rotation    Hold tubing in left hand, elbow at side and forearm out. Rotate forearm in across body. Repeat ____ times per set. Do ____ sets per session. Do ____ sessions per day.  http://orth.exer.us/831   Copyright  VHI. All rights reserved.

## 2015-12-21 NOTE — Therapy (Signed)
[place], [place], [address] Phone: [phone]   Fax:  [phone]  Occupational Therapy Treatment  Patient Details  Name: Tammy Dyer MRN: [id number] Date of Birth: 03-20-1933 Referring Provider: Dr. Joni Fears  Encounter Date: 12/21/2015      OT End of Session - 12/21/15 1642    Visit Number 4   Number of Visits 12   Date for OT Re-Evaluation 12/30/15   Authorization Type [employer]   Authorization Time Period Before 10th visit   Authorization - Visit Number 4   Authorization - Number of Visits 10   OT Start Time 1522   OT Stop Time 1600   OT Time Calculation (min) 38 min   Activity Tolerance Patient tolerated treatment well   Behavior During Therapy [facility] for tasks assessed/performed      Past Medical History:  Diagnosis Date  . CRI (chronic renal insufficiency)   . DM (diabetes mellitus) ([place] II)   . GERD (gastroesophageal reflux disease)   . HTN (hypertension)   . Hypercholesterolemia     Past Surgical History:  Procedure Laterality Date  . ABDOMINAL HYSTERECTOMY    . CATARACT EXTRACTION    . CHOLECYSTECTOMY    . COLONOSCOPY  2005   Rehman: diverticulosis, hemorrhoids, screening 2015  . ESOPHAGOGASTRODUODENOSCOPY  10/22/06   normal esophagus/small hiatal hernia    There were no vitals filed for this visit.      Subjective Assessment - 12/21/15 1638    Subjective  S:  I think my shoulder is getting a lot better.  I think it will always hurt.    Currently in Pain? Yes   Pain Score 3    Pain Location Shoulder   Pain Orientation Right   Pain Descriptors / Indicators Sore   Pain Type Chronic pain   Pain Onset More than a month ago   Pain Frequency Occasional   Aggravating Factors  with use   Pain Relieving Factors rest   Effect of Pain on Daily Activities minimal            [facility] OT Assessment - 12/21/15 0001      Assessment   Diagnosis Right Rotator Cuff  Tear and impingement   Prior Therapy None     Precautions   Precautions None                  OT Treatments/Exercises (OP) - 12/21/15 0001      Exercises   Exercises Shoulder     Shoulder Exercises: Supine   Protraction PROM;AROM;10 reps   Horizontal ABduction PROM;AROM;10 reps   External Rotation PROM;AROM;10 reps   Internal Rotation PROM;AROM;10 reps   Flexion PROM;AROM;10 reps   ABduction PROM;AROM;10 reps     Shoulder Exercises: Seated   Protraction AROM;10 reps   Horizontal ABduction AROM;10 reps   External Rotation AROM;10 reps   Internal Rotation AROM;10 reps   Flexion AROM;10 reps   Abduction AROM;10 reps     Shoulder Exercises: Standing   External Rotation Theraband;10 reps   Theraband Level (Shoulder External Rotation) Level 2 (Red)   Internal Rotation Theraband;10 reps   Theraband Level (Shoulder Internal Rotation) Level 2 (Red)   Extension Theraband;10 reps   Theraband Level (Shoulder Extension) Level 2 (Red)   Row Theraband;10 reps   Theraband Level (Shoulder Row) Level 2 (Red)   Retraction Theraband;10 reps   Theraband Level (Shoulder Retraction) Level 2 (Red)     Shoulder Exercises: ROM/Strengthening  UBE (Upper Arm Bike) 3' forward and 3' reverse 1.0   Proximal Shoulder Strengthening, Supine 10 times A/ROM     Manual Therapy   Manual Therapy Myofascial release   Manual therapy comments Manual therapy completed prior to exercises   Myofascial Release Myofascial release and manual stretching completed to right upper arm, trapezius, and scapularis region to decrease fascial restrctions and increase joint mobility in a pain free zone.                 OT Education - 12/21/15 1641    Education provided Yes   Education Details scapular theraband exercises with mod resist green theraband   Person(s) Educated Patient   Methods Explanation;Demonstration;Handout   Comprehension Verbalized understanding;Returned demonstration           OT Short Term Goals - 11/30/15 1554      OT SHORT TERM GOAL #1   Title Pt will be educated on HEP.    Time 6   Period Weeks   Status On-going     OT SHORT TERM GOAL #2   Title Pt will return to highest level of functioning and independence with ADL tasks using RUE as dominant.    Time 6   Period Weeks   Status On-going     OT SHORT TERM GOAL #3   Title Pt will decrease RUE fascial restrictions from mod to minimal amounts to improve mobility of RUE during reaching tasks.    Time 6   Period Weeks   Status On-going     OT SHORT TERM GOAL #4   Title Pt will decrease RUE pain to 3/10 or less to increase ability to perform ADL tasks using RUE as dominant.    Time 6   Period Weeks   Status On-going     OT SHORT TERM GOAL #5   Title Pt will improve A/ROM to WNL in RUE to increase ability to complete overhead reaching tasks.    Time 6   Period Weeks   Status On-going     OT SHORT TERM GOAL #6   Title Pt will increase RUE strength to 4/5 to improve ability to lift weighted objects during ADL task completion.    Time 6   Period Weeks   Status On-going                  Plan - 12/21/15 1643    Clinical Impression Statement A:  Patient with improved functional use and slight decrease in pain this date.  Began A/ROM in supine and seated and educated on scapular theraband.    Plan P:  Attempt increased repetitions with A/ROM and focus on functional reaching with decreased pain.    OT Home Exercise Plan 10/25: AA/ROM 11/8: A/ROM exercises. Pt can now stop AA/ROM. 12/21/15:  scapular theraband.      Patient will benefit from skilled therapeutic intervention in order to improve the following deficits and impairments:  Impaired flexibility, Decreased strength, Decreased range of motion, Impaired UE functional use, Increased fascial restricitons, Pain  Visit Diagnosis: Chronic right shoulder pain  Other symptoms and signs involving the musculoskeletal  system    Problem List Patient Active Problem List   Diagnosis Date Noted  . GERD 03/15/2010  . POSTHERPETIC NEURALGIA 10/06/2007  . DIABETES MELLITUS 10/06/2007  . HYPERLIPIDEMIA 10/06/2007  . HYPERTENSION 10/06/2007  . EXTERNAL HEMORRHOIDS 10/06/2007  . HIATAL HERNIA 10/06/2007  . DIVERTICULOSIS, COLON 10/06/2007  . CHOLECYSTITIS 10/06/2007  . RENAL DISEASE, CHRONIC 10/06/2007  .  UTI'S, RECURRENT 10/06/2007  . ANOREXIA 10/06/2007  . WEIGHT LOSS 10/06/2007  . NAUSEA, CHRONIC 10/06/2007  . EARLY SATIETY 10/06/2007    Vangie Bicker, [place], OTR/L [phone]  12/21/2015, 4:46 PM  [facility] [address] [place], [place], [address] Phone: [phone]   Fax:  [phone]  Name: KAMRON PORTEE MRN: [id number] Date of Birth: Dec 08, 1933

## 2015-12-28 ENCOUNTER — Ambulatory Visit (HOSPITAL_COMMUNITY): Payer: PPO | Attending: Orthopaedic Surgery

## 2015-12-28 DIAGNOSIS — R29898 Other symptoms and signs involving the musculoskeletal system: Secondary | ICD-10-CM | POA: Insufficient documentation

## 2015-12-28 DIAGNOSIS — M25511 Pain in right shoulder: Secondary | ICD-10-CM | POA: Insufficient documentation

## 2015-12-28 DIAGNOSIS — G8929 Other chronic pain: Secondary | ICD-10-CM | POA: Insufficient documentation

## 2015-12-29 NOTE — Therapy (Signed)
[place], [place], [address] Phone: [phone]   Fax:  [phone]  Occupational Therapy Treatment And reassessment Patient Details  Name: Tammy Dyer MRN: [id number] Date of Birth: [birth year] Referring Provider: Dr. Joni Fears  Encounter Date: 12/28/2015      OT End of Session - 12/28/15 0745    Visit Number 5   Number of Visits 12   Date for OT Re-Evaluation 12/30/15   Authorization Type [employer]   Authorization Time Period Before 10th visit   Authorization - Visit Number 5   Authorization - Number of Visits 10   OT Start Time 1528  D/C today   OT Stop Time 1556   OT Time Calculation (min) 28 min   Activity Tolerance Patient tolerated treatment well   Behavior During Therapy [facility] for tasks assessed/performed      Past Medical History:  Diagnosis Date  . CRI (chronic renal insufficiency)   . DM (diabetes mellitus) ([place])   . GERD (gastroesophageal reflux disease)   . HTN (hypertension)   . Hypercholesterolemia     Past Surgical History:  Procedure Laterality Date  . ABDOMINAL HYSTERECTOMY    . CATARACT EXTRACTION    . CHOLECYSTECTOMY    . COLONOSCOPY  2005   Rehman: diverticulosis, hemorrhoids, screening 2015  . ESOPHAGOGASTRODUODENOSCOPY  10/22/06   normal esophagus/small hiatal hernia    There were no vitals filed for this visit.      Subjective Assessment - 12/28/15 0743    Subjective  S: My shoulder is doing great.   Currently in Pain? Yes   Pain Score 2    Pain Location Shoulder   Pain Orientation Right   Pain Descriptors / Indicators Sore   Pain Type Chronic pain   Pain Radiating Towards N/A   Pain Onset More than a month ago   Pain Frequency Occasional   Aggravating Factors  with use   Pain Relieving Factors rest   Effect of Pain on Daily Activities No effect   Multiple Pain Sites No            [facility] OT Assessment - 12/28/15 1540      Assessment    Diagnosis Right Rotator Cuff Tear and impingement   Onset Date --  July 2017 or before   Prior Therapy None     Precautions   Precautions None     Prior Function   Level of Independence Independent with basic ADLs     AROM   Overall AROM Comments Assessed seated, er/IR adducted   AROM Assessment Site Shoulder   Right/Left Shoulder Right   Right Shoulder Flexion 160 Degrees  previous: 140   Right Shoulder ABduction 160 Degrees  previous: same   Right Shoulder Internal Rotation 90 Degrees  previous: same   Right Shoulder External Rotation 75 Degrees  previous: 58     PROM   Overall PROM  Within functional limits for tasks performed   Overall PROM Comments Assessed supine, er/IR adducted   PROM Assessment Site Shoulder   Right/Left Shoulder Right     Strength   Overall Strength Comments Assessed seated, er/IR adducted   Strength Assessment Site Shoulder   Right/Left Shoulder Right   Right Shoulder Flexion 3+/5  previous: 3/5   Right Shoulder ABduction 4-/5  previous: 3+/5   Right Shoulder Internal Rotation 4+/5  previous: same   Right Shoulder External Rotation 3/5  previous: same  OT Treatments/Exercises (OP) - 01/15/16 0001      Shoulder Exercises: Standing   Extension Theraband;10 reps   Theraband Level (Shoulder Extension) Level 3 (Green)   Row Theraband;10 reps   Theraband Level (Shoulder Row) Level 3 (Green)   Retraction Theraband;10 reps   Theraband Level (Shoulder Retraction) Level 3 Nyoka Cowden)                OT Education - Jan 15, 2016 0744    Education provided Yes   Education Details Pt was instructed to continue to complete scapular strengthening exercises with green theraband   Person(s) Educated Patient   Methods Explanation;Demonstration;Handout;Verbal cues   Comprehension Verbalized understanding;Returned demonstration          OT Short Term Goals - 01-15-2016 1545      OT SHORT TERM GOAL #1   Title Pt  will be educated on HEP.    Time 6   Period Weeks   Status Achieved     OT SHORT TERM GOAL #2   Title Pt will return to highest level of functioning and independence with ADL tasks using RUE as dominant.    Time 6   Period Weeks   Status Achieved     OT SHORT TERM GOAL #3   Title Pt will decrease RUE fascial restrictions from mod to minimal amounts to improve mobility of RUE during reaching tasks.    Time 6   Period Weeks   Status Achieved     OT SHORT TERM GOAL #4   Title Pt will decrease RUE pain to 3/10 or less to increase ability to perform ADL tasks using RUE as dominant.    Time 6   Period Weeks   Status Achieved     OT SHORT TERM GOAL #5   Title Pt will improve A/ROM to WNL in RUE to increase ability to complete overhead reaching tasks.    Time 6   Period Weeks   Status Achieved     OT SHORT TERM GOAL #6   Title Pt will increase RUE strength to 4/5 to improve ability to lift weighted objects during ADL task completion.    Time 6   Period Weeks   Status Not Met                  Plan - 01/15/16 0746    Clinical Impression Statement A: Reassessment and discharge completed this date. patient has met all therapy goals except her strength goal. patient reports that she notices a big improvement with her shoulder. She experiences less pain and is has greater range of motion. HEP was discussed and reviewed. Patient did need physical and verbal cueing with the scapular theraband exercises. Patient is in agreement with discharge plan.   Plan P: D/C with HEP.      Patient will benefit from skilled therapeutic intervention in order to improve the following deficits and impairments:  Impaired flexibility, Decreased strength, Decreased range of motion, Impaired UE functional use, Increased fascial restricitons, Pain  Visit Diagnosis: Chronic right shoulder pain  Other symptoms and signs involving the musculoskeletal system      G-Codes - 01-15-16 0748     Functional Assessment Tool Used clinical judgement   Functional Limitation Carrying, moving and handling objects   Carrying, Moving and Handling Objects Goal Status ([id number]) At least 20 percent but less than 40 percent impaired, limited or restricted   Carrying, Moving and Handling Objects Discharge Status [phone]) At least 20 percent but less than 40  percent impaired, limited or restricted      Problem List Patient Active Problem List   Diagnosis Date Noted  . GERD 03/15/2010  . POSTHERPETIC NEURALGIA 10/06/2007  . DIABETES MELLITUS 10/06/2007  . HYPERLIPIDEMIA 10/06/2007  . HYPERTENSION 10/06/2007  . EXTERNAL HEMORRHOIDS 10/06/2007  . HIATAL HERNIA 10/06/2007  . DIVERTICULOSIS, COLON 10/06/2007  . CHOLECYSTITIS 10/06/2007  . RENAL DISEASE, CHRONIC 10/06/2007  . UTI'S, RECURRENT 10/06/2007  . ANOREXIA 10/06/2007  . WEIGHT LOSS 10/06/2007  . NAUSEA, CHRONIC 10/06/2007  . EARLY SATIETY 10/06/2007   OCCUPATIONAL THERAPY DISCHARGE SUMMARY  Visits from Start of Care: 5  Current functional level related to goals / functional outcomes: See above   Remaining deficits: See above   Education / Equipment: See above Plan: Patient agrees to discharge.  Patient goals were met. Patient is being discharged due to meeting the stated rehab goals.  ?????        Ailene Ravel, OTR/L,CBIS  [phone]  12/29/2015, 7:49 AM  [facility] [address] [place], [place], [address] Phone: [phone]   Fax:  [phone]  Name: Tammy Dyer MRN: [id number] Date of Birth: May 27, 1933

## 2016-01-10 DIAGNOSIS — N184 Chronic kidney disease, stage 4 (severe): Secondary | ICD-10-CM | POA: Diagnosis not present

## 2016-01-10 DIAGNOSIS — E119 Type 2 diabetes mellitus without complications: Secondary | ICD-10-CM | POA: Diagnosis not present

## 2016-01-10 DIAGNOSIS — I10 Essential (primary) hypertension: Secondary | ICD-10-CM | POA: Diagnosis not present

## 2016-01-10 DIAGNOSIS — Z79899 Other long term (current) drug therapy: Secondary | ICD-10-CM | POA: Diagnosis not present

## 2016-01-20 DIAGNOSIS — N184 Chronic kidney disease, stage 4 (severe): Secondary | ICD-10-CM | POA: Diagnosis not present

## 2016-01-20 DIAGNOSIS — E1122 Type 2 diabetes mellitus with diabetic chronic kidney disease: Secondary | ICD-10-CM | POA: Diagnosis not present

## 2016-01-20 DIAGNOSIS — R001 Bradycardia, unspecified: Secondary | ICD-10-CM | POA: Diagnosis not present

## 2016-01-20 DIAGNOSIS — I10 Essential (primary) hypertension: Secondary | ICD-10-CM | POA: Diagnosis not present

## 2016-02-07 ENCOUNTER — Encounter (HOSPITAL_COMMUNITY): Payer: Self-pay | Admitting: Emergency Medicine

## 2016-02-07 ENCOUNTER — Emergency Department (HOSPITAL_COMMUNITY): Payer: PPO

## 2016-02-07 ENCOUNTER — Inpatient Hospital Stay (HOSPITAL_COMMUNITY)
Admission: EM | Admit: 2016-02-07 | Discharge: 2016-02-09 | DRG: 389 | Disposition: A | Discharge status: Home / Self Care | Payer: PPO | Attending: Internal Medicine | Admitting: Internal Medicine

## 2016-02-07 DIAGNOSIS — E876 Hypokalemia: Secondary | ICD-10-CM | POA: Diagnosis present

## 2016-02-07 DIAGNOSIS — Z885 Allergy status to narcotic agent status: Secondary | ICD-10-CM | POA: Diagnosis not present

## 2016-02-07 DIAGNOSIS — Z792 Long term (current) use of antibiotics: Secondary | ICD-10-CM | POA: Diagnosis not present

## 2016-02-07 DIAGNOSIS — Z87440 Personal history of urinary (tract) infections: Secondary | ICD-10-CM

## 2016-02-07 DIAGNOSIS — E7800 Pure hypercholesterolemia, unspecified: Secondary | ICD-10-CM | POA: Diagnosis not present

## 2016-02-07 DIAGNOSIS — I129 Hypertensive chronic kidney disease with stage 1 through stage 4 chronic kidney disease, or unspecified chronic kidney disease: Secondary | ICD-10-CM | POA: Diagnosis not present

## 2016-02-07 DIAGNOSIS — I248 Other forms of acute ischemic heart disease: Secondary | ICD-10-CM | POA: Diagnosis present

## 2016-02-07 DIAGNOSIS — N390 Urinary tract infection, site not specified: Secondary | ICD-10-CM | POA: Diagnosis not present

## 2016-02-07 DIAGNOSIS — J9811 Atelectasis: Secondary | ICD-10-CM | POA: Diagnosis not present

## 2016-02-07 DIAGNOSIS — K56600 Partial intestinal obstruction, unspecified as to cause: Secondary | ICD-10-CM | POA: Diagnosis not present

## 2016-02-07 DIAGNOSIS — Z882 Allergy status to sulfonamides status: Secondary | ICD-10-CM | POA: Diagnosis not present

## 2016-02-07 DIAGNOSIS — N184 Chronic kidney disease, stage 4 (severe): Secondary | ICD-10-CM | POA: Diagnosis not present

## 2016-02-07 DIAGNOSIS — K5730 Diverticulosis of large intestine without perforation or abscess without bleeding: Secondary | ICD-10-CM | POA: Diagnosis present

## 2016-02-07 DIAGNOSIS — N179 Acute kidney failure, unspecified: Secondary | ICD-10-CM

## 2016-02-07 DIAGNOSIS — Z7982 Long term (current) use of aspirin: Secondary | ICD-10-CM | POA: Diagnosis not present

## 2016-02-07 DIAGNOSIS — Z9049 Acquired absence of other specified parts of digestive tract: Secondary | ICD-10-CM | POA: Diagnosis not present

## 2016-02-07 DIAGNOSIS — R7989 Other specified abnormal findings of blood chemistry: Secondary | ICD-10-CM

## 2016-02-07 DIAGNOSIS — E1122 Type 2 diabetes mellitus with diabetic chronic kidney disease: Secondary | ICD-10-CM | POA: Diagnosis not present

## 2016-02-07 DIAGNOSIS — R748 Abnormal levels of other serum enzymes: Secondary | ICD-10-CM | POA: Diagnosis not present

## 2016-02-07 DIAGNOSIS — R9431 Abnormal electrocardiogram [ECG] [EKG]: Secondary | ICD-10-CM

## 2016-02-07 DIAGNOSIS — R1110 Vomiting, unspecified: Secondary | ICD-10-CM

## 2016-02-07 DIAGNOSIS — I214 Non-ST elevation (NSTEMI) myocardial infarction: Secondary | ICD-10-CM | POA: Diagnosis not present

## 2016-02-07 DIAGNOSIS — K219 Gastro-esophageal reflux disease without esophagitis: Secondary | ICD-10-CM | POA: Diagnosis present

## 2016-02-07 DIAGNOSIS — I10 Essential (primary) hypertension: Secondary | ICD-10-CM | POA: Diagnosis present

## 2016-02-07 DIAGNOSIS — R778 Other specified abnormalities of plasma proteins: Secondary | ICD-10-CM

## 2016-02-07 DIAGNOSIS — K56699 Other intestinal obstruction unspecified as to partial versus complete obstruction: Secondary | ICD-10-CM | POA: Diagnosis not present

## 2016-02-07 DIAGNOSIS — K56609 Unspecified intestinal obstruction, unspecified as to partial versus complete obstruction: Secondary | ICD-10-CM | POA: Diagnosis not present

## 2016-02-07 DIAGNOSIS — R1111 Vomiting without nausea: Secondary | ICD-10-CM | POA: Diagnosis not present

## 2016-02-07 DIAGNOSIS — Z79899 Other long term (current) drug therapy: Secondary | ICD-10-CM

## 2016-02-07 DIAGNOSIS — I110 Hypertensive heart disease with heart failure: Secondary | ICD-10-CM | POA: Diagnosis not present

## 2016-02-07 LAB — CBC WITH DIFFERENTIAL/PLATELET
Basophils Absolute: 0 10*3/uL (ref 0.0–0.1)
Basophils Relative: 0 %
EOS ABS: 0.1 10*3/uL (ref 0.0–0.7)
Eosinophils Relative: 1 %
HEMATOCRIT: 35.3 % — AB (ref 36.0–46.0)
HEMOGLOBIN: 11.6 g/dL — AB (ref 12.0–15.0)
LYMPHS ABS: 1 10*3/uL (ref 0.7–4.0)
Lymphocytes Relative: 14 %
MCH: 29.7 pg (ref 26.0–34.0)
MCHC: 32.9 g/dL (ref 30.0–36.0)
MCV: 90.3 fL (ref 78.0–100.0)
MONO ABS: 0.5 10*3/uL (ref 0.1–1.0)
MONOS PCT: 6 %
NEUTROS PCT: 79 %
Neutro Abs: 5.6 10*3/uL (ref 1.7–7.7)
Platelets: 177 10*3/uL (ref 150–400)
RBC: 3.91 MIL/uL (ref 3.87–5.11)
RDW: 15.2 % (ref 11.5–15.5)
WBC: 7.1 10*3/uL (ref 4.0–10.5)

## 2016-02-07 LAB — URINALYSIS, ROUTINE W REFLEX MICROSCOPIC
BILIRUBIN URINE: NEGATIVE
GLUCOSE, UA: NEGATIVE mg/dL
Ketones, ur: NEGATIVE mg/dL
NITRITE: POSITIVE — AB
PH: 5 (ref 5.0–8.0)
Protein, ur: 30 mg/dL — AB
SPECIFIC GRAVITY, URINE: 1.011 (ref 1.005–1.030)

## 2016-02-07 LAB — COMPREHENSIVE METABOLIC PANEL
ALK PHOS: 68 U/L (ref 38–126)
ALT: 16 U/L (ref 14–54)
ANION GAP: 9 (ref 5–15)
AST: 26 U/L (ref 15–41)
Albumin: 3.9 g/dL (ref 3.5–5.0)
BUN: 47 mg/dL — ABNORMAL HIGH (ref 6–20)
CALCIUM: 9.5 mg/dL (ref 8.9–10.3)
CO2: 23 mmol/L (ref 22–32)
Chloride: 103 mmol/L (ref 101–111)
Creatinine, Ser: 2.14 mg/dL — ABNORMAL HIGH (ref 0.44–1.00)
GFR calc non Af Amer: 21 mL/min — ABNORMAL LOW (ref >60)
GFR, EST AFRICAN AMERICAN: 24 mL/min — AB (ref >60)
Glucose, Bld: 177 mg/dL — ABNORMAL HIGH (ref 65–99)
Potassium: 3.8 mmol/L (ref 3.5–5.1)
SODIUM: 135 mmol/L (ref 135–145)
TOTAL PROTEIN: 7.6 g/dL (ref 6.5–8.1)
Total Bilirubin: 0.8 mg/dL (ref 0.3–1.2)

## 2016-02-07 LAB — INFLUENZA PANEL BY PCR (TYPE A & B)
Influenza A By PCR: NEGATIVE
Influenza B By PCR: NEGATIVE

## 2016-02-07 LAB — GLUCOSE, CAPILLARY
GLUCOSE-CAPILLARY: 121 mg/dL — AB (ref 65–99)
Glucose-Capillary: 105 mg/dL — ABNORMAL HIGH (ref 65–99)

## 2016-02-07 LAB — LIPASE, BLOOD: LIPASE: 23 U/L (ref 11–51)

## 2016-02-07 LAB — TROPONIN I
TROPONIN I: 0.03 ng/mL — AB (ref <0.03)
Troponin I: 0.04 ng/mL (ref <0.03)

## 2016-02-07 LAB — MAGNESIUM: Magnesium: 1.9 mg/dL (ref 1.7–2.4)

## 2016-02-07 MED ORDER — METOPROLOL TARTRATE 5 MG/5ML IV SOLN
2.5000 mg | Freq: Four times a day (QID) | INTRAVENOUS | Status: DC
  Administered 2016-02-07 – 2016-02-08 (×2): 2.5 mg via INTRAVENOUS
  Filled 2016-02-07 (×2): qty 5

## 2016-02-07 MED ORDER — ENOXAPARIN SODIUM 30 MG/0.3ML ~~LOC~~ SOLN
30.0000 mg | SUBCUTANEOUS | Status: DC
  Administered 2016-02-07 – 2016-02-08 (×2): 30 mg via SUBCUTANEOUS
  Filled 2016-02-07 (×2): qty 0.3

## 2016-02-07 MED ORDER — INSULIN ASPART 100 UNIT/ML ~~LOC~~ SOLN
0.0000 [IU] | Freq: Three times a day (TID) | SUBCUTANEOUS | Status: DC
  Administered 2016-02-08: 1 [IU] via SUBCUTANEOUS
  Administered 2016-02-09: 3 [IU] via SUBCUTANEOUS

## 2016-02-07 MED ORDER — SODIUM CHLORIDE 0.9 % IV BOLUS (SEPSIS)
1000.0000 mL | Freq: Once | INTRAVENOUS | Status: AC
  Administered 2016-02-07: 1000 mL via INTRAVENOUS

## 2016-02-07 MED ORDER — ACETAMINOPHEN 325 MG PO TABS
650.0000 mg | ORAL_TABLET | Freq: Four times a day (QID) | ORAL | Status: DC | PRN
  Administered 2016-02-09: 650 mg via ORAL
  Filled 2016-02-07: qty 2

## 2016-02-07 MED ORDER — IOPAMIDOL (ISOVUE-300) INJECTION 61%
INTRAVENOUS | Status: AC
  Filled 2016-02-07: qty 60

## 2016-02-07 MED ORDER — CEFTRIAXONE SODIUM 1 G IJ SOLR
INTRAMUSCULAR | Status: AC
  Filled 2016-02-07: qty 10

## 2016-02-07 MED ORDER — ASPIRIN 81 MG PO CHEW
324.0000 mg | CHEWABLE_TABLET | Freq: Once | ORAL | Status: AC
  Administered 2016-02-07: 324 mg via ORAL
  Filled 2016-02-07: qty 4

## 2016-02-07 MED ORDER — SODIUM CHLORIDE 0.9 % IV SOLN
INTRAVENOUS | Status: DC
  Administered 2016-02-07: 22:00:00 via INTRAVENOUS

## 2016-02-07 MED ORDER — CEFTRIAXONE SODIUM 1 G IJ SOLR
1.0000 g | INTRAMUSCULAR | Status: DC
  Administered 2016-02-07 – 2016-02-08 (×2): 1 g via INTRAVENOUS
  Filled 2016-02-07 (×3): qty 10

## 2016-02-07 MED ORDER — ONDANSETRON HCL 4 MG PO TABS: 4.0000 mg | ORAL_TABLET | Freq: Four times a day (QID) | ORAL | Status: DC | PRN

## 2016-02-07 MED ORDER — ACETAMINOPHEN 650 MG RE SUPP: 650.0000 mg | Freq: Four times a day (QID) | RECTAL | Status: DC | PRN

## 2016-02-07 MED ORDER — IPRATROPIUM-ALBUTEROL 0.5-2.5 (3) MG/3ML IN SOLN
3.0000 mL | Freq: Once | RESPIRATORY_TRACT | Status: AC
  Administered 2016-02-07: 3 mL via RESPIRATORY_TRACT
  Filled 2016-02-07: qty 3

## 2016-02-07 MED ORDER — ONDANSETRON HCL 4 MG/2ML IJ SOLN: 4.0000 mg | Freq: Four times a day (QID) | INTRAMUSCULAR | Status: DC | PRN

## 2016-02-07 NOTE — ED Triage Notes (Signed)
Pt reports weakness, n/v since last night, abdominal pain, cough, and cramping in legs.  Pt alert and oriented.

## 2016-02-07 NOTE — Progress Notes (Signed)
Pharmacy Antibiotic Note  Tammy Dyer is a 83 y.o. female admitted on 02/07/2016 with UTI.  Pharmacy has been consulted for Rocephin dosing.  Plan: Rocephin 1gram IV q24hrs.   Height: 5\' 1"  (154.9 cm) Weight: 154 lb 15.7 oz (70.3 kg) IBW/kg (Calculated) : 47.8  Temp (24hrs), Avg:98.4 F (36.9 C), Min:98.4 F (36.9 C), Max:98.4 F (36.9 C)   Recent Labs Lab 02/07/16 1614  WBC 7.1  CREATININE 2.14*    Estimated Creatinine Clearance: 18.8 mL/min (by C-G formula based on SCr of 2.14 mg/dL (H)).    Allergies  Allergen Reactions  . Codeine     REACTION: head ache  . Sulfonamide Derivatives     REACTION: yeast infection    Antimicrobials this admission: Dose adjustments this admission:   Microbiology results:   Thank you for allowing pharmacy to be a part of this patient's care.  Jerrye Beavers, PharmD, BCPS Clinical Pharmacist 02/07/2016 9:28 PM

## 2016-02-07 NOTE — H&P (Signed)
TRH H&P    Patient Demographics:    Tammy Dyer, is a 83 y.o. female  MRN: [id number]  DOB - 08-05-1933  Admit Date - 02/07/2016  Referring MD/NP/PA: Dr. Dolly Rias  Outpatient Primary MD for the patient is Asencion Noble, MD  Patient coming from: *Home  Chief Complaint  Patient presents with  . Weakness      HPI:    Tammy Dyer  is a 84 y.o. female, With history of hypertension, status post cholecystectomy and hysterectomy, GERD, diabetes mellitus came to hospital with a 2 day history of nausea and vomiting with epigastric pain. Patient says that she ate coleslaw on Sunday and did not feel good started having abdominal pain on Monday and followed by vomiting. She continued to vomit to 6:30 AM this morning. She hasn't had vomited since then. Also complains of abdominal pain. No diarrhea. She is unable to pass gas. Denies chest pain or shortness of breath. Did not pass out. No fever. No dysuria.  In the ED CT of the abdomen and pelvis showed early/ partial small bowel obstruction, and sigmoid diverticulosis.    Review of systems:    In addition to the HPI above,  No Fever-chills, No Headache, No changes with Vision or hearing, No problems swallowing food or Liquids, No Chest pain, Cough or Shortness of Breath, No Blood in stool or Urine, No dysuria, No new skin rashes or bruises, No new joints pains-aches,  No new weakness, tingling, numbness in any extremity, No recent weight gain or loss, No polyuria, polydypsia or polyphagia, No significant Mental Stressors.  A full 10 point Review of Systems was done, except as stated above, all other Review of Systems were negative.   With Past History of the following :    Past Medical History:  Diagnosis Date  . CRI (chronic renal insufficiency)   . DM (diabetes mellitus) ([place])   . GERD (gastroesophageal reflux disease)   . HTN (hypertension)   .  Hypercholesterolemia       Past Surgical History:  Procedure Laterality Date  . ABDOMINAL HYSTERECTOMY    . CATARACT EXTRACTION    . CHOLECYSTECTOMY    . COLONOSCOPY  2005   Rehman: diverticulosis, hemorrhoids, screening 2015  . ESOPHAGOGASTRODUODENOSCOPY  10/22/06   normal esophagus/small hiatal hernia      Social History:      Social History  Substance Use Topics  . Smoking status: Never Smoker  . Smokeless tobacco: Not on file  . Alcohol use No       Family History :     Family History  Problem Relation Age of Onset  . Colon cancer Neg Hx       Home Medications:   Prior to Admission medications   Medication Sig Start Date End Date Taking? Authorizing Provider  acetaminophen (TYLENOL) 500 MG tablet Take 500 mg by mouth every 6 (six) hours as needed for mild pain or moderate pain.   Yes Historical Provider, MD  amLODipine (NORVASC) 5 MG tablet Take 5 mg by mouth daily.   Yes  Historical Provider, MD  aspirin 81 MG tablet Take 81 mg by mouth daily.   Yes Historical Provider, MD  atenolol (TENORMIN) 25 MG tablet Take 25 mg by mouth daily.   Yes Historical Provider, MD  atorvastatin (LIPITOR) 20 MG tablet Take 20 mg by mouth every evening.  03/24/11  Yes Historical Provider, MD  benzonatate (TESSALON) 200 MG capsule Take 200 mg by mouth 3 (three) times daily as needed for cough.   Yes Historical Provider, MD  chlorthalidone (HYGROTON) 25 MG tablet Take 25 mg by mouth daily.   Yes Historical Provider, MD  glimepiride (AMARYL) 2 MG tablet Take 2 mg by mouth daily before breakfast.  03/12/11  Yes Historical Provider, MD  Liniments (BLUE-EMU SUPER STRENGTH) CREA Apply 1 application topically daily as needed (to arm(s) for pain).   Yes Historical Provider, MD  Melatonin 10 MG TABS Take 10 mg by mouth at bedtime.   Yes Historical Provider, MD  omeprazole (PRILOSEC) 20 MG capsule Take 1 capsule (20 mg total) by mouth daily. 04/19/15  Yes Daneil Dolin, MD  pioglitazone (ACTOS) 30  MG tablet Take 30 mg by mouth daily.  03/04/14  Yes Historical Provider, MD  sertraline (ZOLOFT) 100 MG tablet Take 100 mg by mouth daily.  03/31/11  Yes Historical Provider, MD  trimethoprim (TRIMPEX) 100 MG tablet Take 100 mg by mouth daily.  03/27/14  Yes Historical Provider, MD     Allergies:     Allergies  Allergen Reactions  . Codeine     REACTION: head ache  . Sulfonamide Derivatives     REACTION: yeast infection     Physical Exam:   Vitals  Blood pressure 169/60, pulse 69, temperature 98.4 F (36.9 C), temperature source Oral, resp. rate 15, height 5\' 1"  (1.549 m), weight 73.5 kg (162 lb), SpO2 99 %.  1.  General: Caucasian female in no acute distress  2. Psychiatric:  Intact judgement and  insight, awake alert, oriented x 3.  3. Neurologic: No focal neurological deficits, all cranial nerves intact.Strength 5/5 all 4 extremities, sensation intact all 4 extremities, plantars down going.  4. Eyes :  anicteric sclerae, moist conjunctivae with no lid lag. PERRLA.  5. ENMT:  Oropharynx clear with moist mucous membranes and good dentition  6. Neck:  supple, no cervical lymphadenopathy appriciated, No thyromegaly  7. Respiratory : Normal respiratory effort, good air movement bilaterally,clear to  auscultation bilaterally  8. Cardiovascular : RRR, no gallops, rubs or murmurs, no leg edema  9. Gastrointestinal:  Positive bowel sounds, abdomen soft, mild epigastric tenderness to palpation, mild distention ,no hepatosplenomegaly, no rigidity or guarding       10. Skin:  No cyanosis, normal texture and turgor, no rash, lesions or ulcers  11.Musculoskeletal:  Good muscle tone,  joints appear normal , no effusions,  normal range of motion    Data Review:    CBC  Recent Labs Lab 02/07/16 1614  WBC 7.1  HGB 11.6*  HCT 35.3*  PLT 177  MCV 90.3  MCH 29.7  MCHC 32.9  RDW 15.2  LYMPHSABS 1.0  MONOABS 0.5  EOSABS 0.1  BASOSABS 0.0    ------------------------------------------------------------------------------------------------------------------  Chemistries   Recent Labs Lab 02/07/16 1614 02/07/16 1624  NA 135  --   K 3.8  --   CL 103  --   CO2 23  --   GLUCOSE 177*  --   BUN 47*  --   CREATININE 2.14*  --   CALCIUM 9.5  --  MG  --  1.9  AST 26  --   ALT 16  --   ALKPHOS 68  --   BILITOT 0.8  --    ------------------------------------------------------------------------------------------------------------------  ------------------------------------------------------------------------------------------------------------------ GFR: Estimated Creatinine Clearance: 19.2 mL/min (by C-G formula based on SCr of 2.14 mg/dL (H)). Liver Function Tests:  Recent Labs Lab 02/07/16 1614  AST 26  ALT 16  ALKPHOS 68  BILITOT 0.8  PROT 7.6  ALBUMIN 3.9    Recent Labs Lab 02/07/16 1614  LIPASE 23   No results for input(s): AMMONIA in the last 168 hours. Coagulation Profile: No results for input(s): INR, PROTIME in the last 168 hours. Cardiac Enzymes:  Recent Labs Lab 02/07/16 1624  TROPONINI 0.03*    --------------------------------------------------------------------------------------------------------------- Urine analysis:    Component Value Date/Time   COLORURINE AMBER (A) 02/07/2016 1600   APPEARANCEUR CLEAR 02/07/2016 1600   LABSPEC 1.011 02/07/2016 1600   PHURINE 5.0 02/07/2016 1600   GLUCOSEU NEGATIVE 02/07/2016 1600   HGBUR MODERATE (A) 02/07/2016 1600   BILIRUBINUR NEGATIVE 02/07/2016 1600   KETONESUR NEGATIVE 02/07/2016 1600   PROTEINUR 30 (A) 02/07/2016 1600   NITRITE POSITIVE (A) 02/07/2016 1600   LEUKOCYTESUR SMALL (A) 02/07/2016 1600      Imaging Results:    Ct Abdomen Pelvis Wo Contrast  Result Date: 02/07/2016 CLINICAL DATA:  Weakness, nausea, vomiting and abdominal pain beginning last night. Cough and leg cramping. History of hypertension, diabetes,  hypercholesterolemia, chronic renal failure. EXAM: CT ABDOMEN AND PELVIS WITHOUT CONTRAST TECHNIQUE: Multidetector CT imaging of the abdomen and pelvis was performed following the standard protocol without IV contrast. Oral contrast administered. COMPARISON:  CT abdomen and pelvis August 30, 2006 FINDINGS: LOWER CHEST: RIGHT lung base atelectasis/scarring The visualized heart size is normal. Mild coronary artery calcifications. No pericardial effusion. HEPATOBILIARY: Status post cholecystectomy. Normal noncontrast CT appearance of the liver. PANCREAS: Normal. SPLEEN: Multiple punctate splenic granulomas. ADRENALS/URINARY TRACT: Kidneys are orthotopic, demonstrating normal size and morphology. No nephrolithiasis, hydronephrosis; limited assessment for renal masses on this nonenhanced examination. The unopacified ureters are normal in course and caliber. Urinary bladder is partially distended and unremarkable. Normal adrenal glands. STOMACH/BOWEL: Small bowel dilated up to 3.3 cm with air-fluid levels in mid to distal small bowel feces. Transition point LEFT mid abdomen. The stomach, large bowel are normal in course and caliber without inflammatory changes. Enteric contrast has not yet reached the distal small bowel. Moderate amount of retained large bowel stool. Severe sigmoid diverticulosis with a few scattered additional colonic diverticula. Normal appendix. VASCULAR/LYMPHATIC: Aortoiliac vessels are normal in course and caliber, moderate to severe calcific atherosclerosis. No lymphadenopathy by CT size criteria. REPRODUCTIVE: Status post hysterectomy. OTHER: No intraperitoneal free fluid or free air. MUSCULOSKELETAL: Non-acute. Anterior abdominal wall scarring. Punctate calcification bilateral breast would be better characterized on mammogram as clinically indicated. Minimal grade 1 L4-5 anterolisthesis. Severe L1-2 thru L4-5 degenerative discs with multilevel severe facet arthropathy. IMPRESSION: Early versus  partial small bowel obstruction, likely secondary to adhesions with transition point LEFT abdomen. Severe sigmoid diverticulosis. Electronically Signed   By: Elon Alas M.D.   On: 02/07/2016 18:50   Dg Chest 2 View  Result Date: 02/07/2016 CLINICAL DATA:  Weakness nausea vomiting and abdominal pain EXAM: CHEST  2 VIEW COMPARISON:  02/17/2007 FINDINGS: Small focus of atelectasis at the lingula. No acute consolidation or effusion. Stable borderline enlarged cardiomediastinal silhouette with atherosclerosis. No pneumothorax. IMPRESSION: Minimal atelectasis at the lingula. Otherwise no radiographic evidence for acute cardiopulmonary abnormality Electronically Signed  By: Donavan Foil M.D.   On: 02/07/2016 18:29    My personal review of EKG: Rhythm NSR, ST depression in lead 23 aVF, V3 V4 V5 V6   Assessment & Plan:    Active Problems:   Essential hypertension   Small bowel obstruction   Elevated troponin   UTI  1. Partial small bowel obstruction- patient came with partial small bowel obstruction, no more vomiting at this time. NG tube advised but patient is refusing at this time. We will keep her nothing by mouth, continue IV fluids with normal saline at 75 mL polyp. Will check abdominal x-ray in a.m. Consult general surgery in a.m. 2. UTI- patient's UA shows positive nitrite, will obtain urine culture and start empiric Rocephin 1 g IV daily. Follow urine culture results. 3. Elevated troponin/abnormal EKG- patient's EKG is abnormal with ST depression needed to 3 aVF, V3 V4 V5 V6. Troponin is 0.03. She denies chest pain. Will check echocardiogram in a.m. Cardiology was consulted by the ED physician, no acute workup advised at this time. We will get formal cardiology consultation in a.m. we will cycle troponin every 6 hours 3. 4. Diabetes mellitus- hold Amaryl, will start sliding scale insulin with NovoLog. 5. Hypertension- blood pressure is mildly elevated, will start Lopressor 2.5 mg IV  every 6 hours. Hold atenolol and amlodipine at this time.   DVT Prophylaxis-   Lovenox   AM Labs Ordered, also please review Full Orders  Family Communication: Admission, patients condition and plan of care including tests being ordered have been discussed with the patient and her husband and daughter at bedside  who indicate understanding and agree with the plan and Code Status.  Code Status:  Full code  Admission status: Observation    Time spent in minutes : 60 minutes   Maddyx Vallie S M.D on 02/07/2016 at 7:59 PM  Between 7am to 7pm - Pager - [phone]. After 7pm go to [email or web address] - password [facility]  [facility] - Office  [phone]

## 2016-02-07 NOTE — ED Notes (Signed)
CRITICAL VALUE ALERT  Critical value received:  Troponin 0.03  Date of notification:  02/07/16  Time of notification:  8727  Critical value read back:Yes.    Nurse who received alert:  Charmayne Sheer, RN  MD notified (1st page):  [place]

## 2016-02-07 NOTE — ED Provider Notes (Signed)
[facility] DEPT Provider Note   CSN: [id number] Arrival date & time: 02/07/16  1500     History   Chief Complaint Chief Complaint  Patient presents with  . Weakness    HPI Tammy Dyer is a 83 y.o. female.   Emesis   This is a new problem. The current episode started yesterday. The problem occurs 2 to 4 times per day. The problem has been gradually improving. The emesis has an appearance of stomach contents. There has been no fever. Associated symptoms include abdominal pain, chills, cough and myalgias (leg cramps since vomiting).    Past Medical History:  Diagnosis Date  . CRI (chronic renal insufficiency)   . DM (diabetes mellitus) ([place])   . GERD (gastroesophageal reflux disease)   . HTN (hypertension)   . Hypercholesterolemia     Patient Active Problem List   Diagnosis Date Noted  . GERD 03/15/2010  . POSTHERPETIC NEURALGIA 10/06/2007  . DIABETES MELLITUS 10/06/2007  . HYPERLIPIDEMIA 10/06/2007  . HYPERTENSION 10/06/2007  . EXTERNAL HEMORRHOIDS 10/06/2007  . HIATAL HERNIA 10/06/2007  . DIVERTICULOSIS, COLON 10/06/2007  . CHOLECYSTITIS 10/06/2007  . RENAL DISEASE, CHRONIC 10/06/2007  . UTI'S, RECURRENT 10/06/2007  . ANOREXIA 10/06/2007  . WEIGHT LOSS 10/06/2007  . NAUSEA, CHRONIC 10/06/2007  . EARLY SATIETY 10/06/2007    Past Surgical History:  Procedure Laterality Date  . ABDOMINAL HYSTERECTOMY    . CATARACT EXTRACTION    . CHOLECYSTECTOMY    . COLONOSCOPY  2005   Rehman: diverticulosis, hemorrhoids, screening 2015  . ESOPHAGOGASTRODUODENOSCOPY  10/22/06   normal esophagus/small hiatal hernia    OB History    No data available       Home Medications    Prior to Admission medications   Medication Sig Start Date End Date Taking? Authorizing Provider  aspirin 81 MG tablet Take 81 mg by mouth daily.    Historical Provider, MD  atenolol-chlorthalidone (TENORETIC) 50-25 MG per tablet Take 1 tablet by mouth daily.  03/12/11   Historical  Provider, MD  atorvastatin (LIPITOR) 20 MG tablet Take 20 mg by mouth daily.  03/24/11   Historical Provider, MD  glimepiride (AMARYL) 2 MG tablet Take 2 mg by mouth daily before breakfast.  03/12/11   Historical Provider, MD  losartan (COZAAR) 100 MG tablet Take 100 mg by mouth daily. Reported on 04/19/2015 03/31/11   Historical Provider, MD  metFORMIN (GLUCOPHAGE) 500 MG tablet Take 500 mg by mouth 2 (two) times daily with a meal. Reported on 04/19/2015 03/12/11   Historical Provider, MD  omeprazole (PRILOSEC) 20 MG capsule Take 1 capsule (20 mg total) by mouth daily. 03/23/14   Annitta Needs, NP  omeprazole (PRILOSEC) 20 MG capsule Take 1 capsule (20 mg total) by mouth daily. 04/19/15   Daneil Dolin, MD  pioglitazone (ACTOS) 15 MG tablet Take 30 mg by mouth daily.  03/04/14   Historical Provider, MD  sertraline (ZOLOFT) 100 MG tablet Take 100 mg by mouth daily.  03/31/11   Historical Provider, MD  trimethoprim (TRIMPEX) 100 MG tablet  03/27/14   Historical Provider, MD    Family History Family History  Problem Relation Age of Onset  . Colon cancer Neg Hx     Social History Social History  Substance Use Topics  . Smoking status: Never Smoker  . Smokeless tobacco: Not on file  . Alcohol use No     Allergies   Codeine and Sulfonamide derivatives   Review of Systems Review of Systems  Constitutional: Positive for chills and fatigue.  Respiratory: Positive for cough.   Gastrointestinal: Positive for abdominal pain, constipation (and no flatulence for >24 hours), nausea and vomiting.  Musculoskeletal: Positive for myalgias (leg cramps since vomiting).  Neurological: Positive for weakness.  All other systems reviewed and are negative.    Physical Exam Updated Vital Signs BP 153/60   Pulse 67   Temp 98.4 F (36.9 C) (Oral)   Resp 16   Ht 5\' 1"  (1.549 m)   Wt 162 lb (73.5 kg)   SpO2 96%   BMI 30.61 kg/m   Physical Exam  Constitutional: She appears well-developed and well-nourished.   HENT:  Head: Normocephalic and atraumatic.  Eyes: Conjunctivae and EOM are normal.  Neck: Normal range of motion.  Cardiovascular: Normal rate and regular rhythm.   Pulmonary/Chest: No stridor. No respiratory distress. She has wheezes.  Abdominal: Soft. She exhibits no distension.  Musculoskeletal: She exhibits no tenderness.  Neurological: She is alert.  Skin: Skin is warm and dry. No pallor.  Nursing note and vitals reviewed.    ED Treatments / Results  Labs (all labs ordered are listed, but only abnormal results are displayed) Labs Reviewed  CBC WITH DIFFERENTIAL/PLATELET  COMPREHENSIVE METABOLIC PANEL  LIPASE, BLOOD  URINALYSIS, ROUTINE W REFLEX MICROSCOPIC  MAGNESIUM  TROPONIN I    EKG  EKG Interpretation  Date/Time:  Tuesday February 07 2016 16:17:01 EST Ventricular Rate:  67 PR Interval:    QRS Duration: 82 QT Interval:  386 QTC Calculation: 408 R Axis:   21 Text Interpretation:  Sinus rhythm Borderline repolarization abnormality diffuse ST depressions No old tracing to compare Confirmed by [facility] MD, Corene Cornea [phone]) on 02/07/2016 5:05:28 PM       Radiology No results found.  Procedures Procedures (including critical care time)  Medications Ordered in ED Medications  sodium chloride 0.9 % bolus 1,000 mL (not administered)  ipratropium-albuterol (DUONEB) 0.5-2.5 (3) MG/3ML nebulizer solution 3 mL (not administered)     Initial Impression / Assessment and Plan / ED Course  I have reviewed the triage vital signs and the nursing notes.  Pertinent labs & imaging results that were available during my care of the patient were reviewed by me and considered in my medical decision making (see chart for details).  Clinical Course    Pancreatitis v gastroenteritis v cardiac v influenza New ST depressions and barel positive troponin, ASA given, discussed with Dr. Meda Coffee, on call cardiologist at [facility], who recommends outpatietn follow up after GI illness has  improved.  Ct and labs c/w dehydration, AKI likely from SBO. No gas/BM in 24 hours, so discussed witih hospitalist and will admit for same and ACS rule out.   Final Clinical Impressions(s) / ED Diagnoses   Final diagnoses:  SBO (small bowel obstruction)  AKI (acute kidney injury) ([place])  ST segment depression  Elevated troponin    New Prescriptions New Prescriptions   No medications on file     Merrily Pew, MD 02/08/16 1230

## 2016-02-08 ENCOUNTER — Observation Stay (HOSPITAL_COMMUNITY): Payer: PPO

## 2016-02-08 DIAGNOSIS — E1122 Type 2 diabetes mellitus with diabetic chronic kidney disease: Secondary | ICD-10-CM | POA: Diagnosis not present

## 2016-02-08 DIAGNOSIS — E876 Hypokalemia: Secondary | ICD-10-CM | POA: Diagnosis not present

## 2016-02-08 DIAGNOSIS — Z9049 Acquired absence of other specified parts of digestive tract: Secondary | ICD-10-CM | POA: Diagnosis not present

## 2016-02-08 DIAGNOSIS — Z885 Allergy status to narcotic agent status: Secondary | ICD-10-CM | POA: Diagnosis not present

## 2016-02-08 DIAGNOSIS — Z79899 Other long term (current) drug therapy: Secondary | ICD-10-CM | POA: Diagnosis not present

## 2016-02-08 DIAGNOSIS — K56609 Unspecified intestinal obstruction, unspecified as to partial versus complete obstruction: Secondary | ICD-10-CM | POA: Diagnosis not present

## 2016-02-08 DIAGNOSIS — R748 Abnormal levels of other serum enzymes: Secondary | ICD-10-CM | POA: Diagnosis not present

## 2016-02-08 DIAGNOSIS — R112 Nausea with vomiting, unspecified: Secondary | ICD-10-CM | POA: Diagnosis not present

## 2016-02-08 DIAGNOSIS — N390 Urinary tract infection, site not specified: Secondary | ICD-10-CM | POA: Diagnosis not present

## 2016-02-08 DIAGNOSIS — Z792 Long term (current) use of antibiotics: Secondary | ICD-10-CM | POA: Diagnosis not present

## 2016-02-08 DIAGNOSIS — Z87440 Personal history of urinary (tract) infections: Secondary | ICD-10-CM | POA: Diagnosis not present

## 2016-02-08 DIAGNOSIS — Z882 Allergy status to sulfonamides status: Secondary | ICD-10-CM | POA: Diagnosis not present

## 2016-02-08 DIAGNOSIS — E7800 Pure hypercholesterolemia, unspecified: Secondary | ICD-10-CM | POA: Diagnosis not present

## 2016-02-08 DIAGNOSIS — Z7982 Long term (current) use of aspirin: Secondary | ICD-10-CM | POA: Diagnosis not present

## 2016-02-08 DIAGNOSIS — K5730 Diverticulosis of large intestine without perforation or abscess without bleeding: Secondary | ICD-10-CM | POA: Diagnosis not present

## 2016-02-08 DIAGNOSIS — I10 Essential (primary) hypertension: Secondary | ICD-10-CM | POA: Diagnosis not present

## 2016-02-08 DIAGNOSIS — N184 Chronic kidney disease, stage 4 (severe): Secondary | ICD-10-CM | POA: Diagnosis not present

## 2016-02-08 DIAGNOSIS — K56600 Partial intestinal obstruction, unspecified as to cause: Secondary | ICD-10-CM | POA: Diagnosis not present

## 2016-02-08 DIAGNOSIS — I129 Hypertensive chronic kidney disease with stage 1 through stage 4 chronic kidney disease, or unspecified chronic kidney disease: Secondary | ICD-10-CM | POA: Diagnosis not present

## 2016-02-08 DIAGNOSIS — K219 Gastro-esophageal reflux disease without esophagitis: Secondary | ICD-10-CM | POA: Diagnosis not present

## 2016-02-08 DIAGNOSIS — I248 Other forms of acute ischemic heart disease: Secondary | ICD-10-CM | POA: Diagnosis not present

## 2016-02-08 LAB — GLUCOSE, CAPILLARY
GLUCOSE-CAPILLARY: 139 mg/dL — AB (ref 65–99)
Glucose-Capillary: 81 mg/dL (ref 65–99)
Glucose-Capillary: 75 mg/dL (ref 65–99)

## 2016-02-08 LAB — COMPREHENSIVE METABOLIC PANEL
ALBUMIN: 3.4 g/dL — AB (ref 3.5–5.0)
ALT: 14 U/L (ref 14–54)
ANION GAP: 8 (ref 5–15)
AST: 23 U/L (ref 15–41)
Alkaline Phosphatase: 55 U/L (ref 38–126)
BUN: 43 mg/dL — AB (ref 6–20)
CHLORIDE: 110 mmol/L (ref 101–111)
CO2: 22 mmol/L (ref 22–32)
Calcium: 8.7 mg/dL — ABNORMAL LOW (ref 8.9–10.3)
Creatinine, Ser: 1.88 mg/dL — ABNORMAL HIGH (ref 0.44–1.00)
GFR calc Af Amer: 28 mL/min — ABNORMAL LOW (ref >60)
GFR calc non Af Amer: 24 mL/min — ABNORMAL LOW (ref >60)
GLUCOSE: 72 mg/dL (ref 65–99)
POTASSIUM: 3.3 mmol/L — AB (ref 3.5–5.1)
SODIUM: 140 mmol/L (ref 135–145)
TOTAL PROTEIN: 6.6 g/dL (ref 6.5–8.1)
Total Bilirubin: 0.6 mg/dL (ref 0.3–1.2)

## 2016-02-08 LAB — CBC
HCT: 32.5 % — ABNORMAL LOW (ref 36.0–46.0)
Hemoglobin: 10.9 g/dL — ABNORMAL LOW (ref 12.0–15.0)
MCH: 30.3 pg (ref 26.0–34.0)
MCHC: 33.5 g/dL (ref 30.0–36.0)
MCV: 90.3 fL (ref 78.0–100.0)
PLATELETS: 150 10*3/uL (ref 150–400)
RBC: 3.6 MIL/uL — ABNORMAL LOW (ref 3.87–5.11)
RDW: 15.2 % (ref 11.5–15.5)
WBC: 5.2 10*3/uL (ref 4.0–10.5)

## 2016-02-08 LAB — TROPONIN I: Troponin I: 0.05 ng/mL (ref <0.03)

## 2016-02-08 MED ORDER — POTASSIUM CHLORIDE 10 MEQ/100ML IV SOLN
10.0000 meq | INTRAVENOUS | Status: AC
  Administered 2016-02-08 (×3): 10 meq via INTRAVENOUS
  Filled 2016-02-08 (×3): qty 100

## 2016-02-08 MED ORDER — AMLODIPINE BESYLATE 5 MG PO TABS
5.0000 mg | ORAL_TABLET | Freq: Every day | ORAL | Status: DC
  Administered 2016-02-08 – 2016-02-09 (×2): 5 mg via ORAL
  Filled 2016-02-08 (×2): qty 1

## 2016-02-08 MED ORDER — ATENOLOL 25 MG PO TABS
25.0000 mg | ORAL_TABLET | Freq: Every day | ORAL | Status: DC
  Administered 2016-02-08 – 2016-02-09 (×2): 25 mg via ORAL
  Filled 2016-02-08 (×2): qty 1

## 2016-02-08 MED ORDER — SERTRALINE HCL 50 MG PO TABS
100.0000 mg | ORAL_TABLET | Freq: Every day | ORAL | Status: DC
  Administered 2016-02-08 – 2016-02-09 (×2): 100 mg via ORAL
  Filled 2016-02-08 (×2): qty 2

## 2016-02-08 MED ORDER — PANTOPRAZOLE SODIUM 40 MG PO TBEC
40.0000 mg | DELAYED_RELEASE_TABLET | Freq: Every day | ORAL | Status: DC
  Administered 2016-02-08 – 2016-02-09 (×2): 40 mg via ORAL
  Filled 2016-02-08 (×2): qty 1

## 2016-02-08 MED ORDER — SODIUM CHLORIDE 0.9 % IV SOLN: 30.0000 meq | Freq: Once | INTRAVENOUS | Status: DC

## 2016-02-08 NOTE — Progress Notes (Signed)
Subjective: She was admitted yesterday after presenting with nausea and vomiting and abdominal pain. CT scan revealed early/partial small bowel obstruction. There was no fever or leukocytosis. She last vomited yesterday morning and denies any nausea or any pain now. She has received IV fluids overnight. Nasogastric suctioning has not been required.  Objective: Vital signs in last 24 hours: Vitals:   02/07/16 1745 02/07/16 1800 02/07/16 2038 02/08/16 0544  BP:  169/60 (!) 177/64 (!) 158/52  Pulse: 67 69 68 66  Resp: 18 15  15   Temp:   98.4 F (36.9 C) 98.1 F (36.7 C)  TempSrc:   Oral Oral  SpO2: 99% 99% 99% 95%  Weight:   154 lb 15.7 oz (70.3 kg)   Height:   5\' 1"  (1.549 m)    Weight change:   Intake/Output Summary (Last 24 hours) at 02/08/16 0729 Last data filed at 02/08/16 0500  Gross per 24 hour  Intake          1411.25 ml  Output                2 ml  Net          1409.25 ml    Physical Exam: Alert. No distress. No scleral icterus. Pharynx moist. Lungs clear. Heart regular with no murmurs. Abdomen reveals active bowel sounds. No distention. Soft and nontender without hepatosplenomegaly. Extremities reveal no edema.  Lab Results:    Results for orders placed or performed during the hospital encounter of 02/07/16 (from the past 24 hour(s))  Urinalysis, Routine w reflex microscopic     Status: Abnormal   Collection Time: 02/07/16  4:00 PM  Result Value Ref Range   Color, Urine AMBER (A) YELLOW   APPearance CLEAR CLEAR   Specific Gravity, Urine 1.011 1.005 - 1.030   pH 5.0 5.0 - 8.0   Glucose, UA NEGATIVE NEGATIVE mg/dL   Hgb urine dipstick MODERATE (A) NEGATIVE   Bilirubin Urine NEGATIVE NEGATIVE   Ketones, ur NEGATIVE NEGATIVE mg/dL   Protein, ur 30 (A) NEGATIVE mg/dL   Nitrite POSITIVE (A) NEGATIVE   Leukocytes, UA SMALL (A) NEGATIVE   RBC / HPF 0-5 0 - 5 RBC/hpf   WBC, UA 6-30 0 - 5 WBC/hpf   Bacteria, UA MANY (A) NONE SEEN   Hyaline Casts, UA PRESENT    Influenza panel by PCR (type A & B)     Status: None   Collection Time: 02/07/16  4:09 PM  Result Value Ref Range   Influenza A By PCR NEGATIVE NEGATIVE   Influenza B By PCR NEGATIVE NEGATIVE  CBC with Differential     Status: Abnormal   Collection Time: 02/07/16  4:14 PM  Result Value Ref Range   WBC 7.1 4.0 - 10.5 K/uL   RBC 3.91 3.87 - 5.11 MIL/uL   Hemoglobin 11.6 (L) 12.0 - 15.0 g/dL   HCT 35.3 (L) 36.0 - 46.0 %   MCV 90.3 78.0 - 100.0 fL   MCH 29.7 26.0 - 34.0 pg   MCHC 32.9 30.0 - 36.0 g/dL   RDW 15.2 11.5 - 15.5 %   Platelets 177 150 - 400 K/uL   Neutrophils Relative % 79 %   Neutro Abs 5.6 1.7 - 7.7 K/uL   Lymphocytes Relative 14 %   Lymphs Abs 1.0 0.7 - 4.0 K/uL   Monocytes Relative 6 %   Monocytes Absolute 0.5 0.1 - 1.0 K/uL   Eosinophils Relative 1 %   Eosinophils Absolute 0.1 0.0 -  0.7 K/uL   Basophils Relative 0 %   Basophils Absolute 0.0 0.0 - 0.1 K/uL  Comprehensive metabolic panel     Status: Abnormal   Collection Time: 02/07/16  4:14 PM  Result Value Ref Range   Sodium 135 135 - 145 mmol/L   Potassium 3.8 3.5 - 5.1 mmol/L   Chloride 103 101 - 111 mmol/L   CO2 23 22 - 32 mmol/L   Glucose, Bld 177 (H) 65 - 99 mg/dL   BUN 47 (H) 6 - 20 mg/dL   Creatinine, Ser 2.14 (H) 0.44 - 1.00 mg/dL   Calcium 9.5 8.9 - 10.3 mg/dL   Total Protein 7.6 6.5 - 8.1 g/dL   Albumin 3.9 3.5 - 5.0 g/dL   AST 26 15 - 41 U/L   ALT 16 14 - 54 U/L   Alkaline Phosphatase 68 38 - 126 U/L   Total Bilirubin 0.8 0.3 - 1.2 mg/dL   GFR calc non Af Amer 21 (L) >60 mL/min   GFR calc Af Amer 24 (L) >60 mL/min   Anion gap 9 5 - 15  Lipase, blood     Status: None   Collection Time: 02/07/16  4:14 PM  Result Value Ref Range   Lipase 23 11 - 51 U/L  Magnesium     Status: None   Collection Time: 02/07/16  4:24 PM  Result Value Ref Range   Magnesium 1.9 1.7 - 2.4 mg/dL  Troponin I     Status: Abnormal   Collection Time: 02/07/16  4:24 PM  Result Value Ref Range   Troponin I 0.03 (HH)  <0.03 ng/mL  Glucose, capillary     Status: Abnormal   Collection Time: 02/07/16  8:49 PM  Result Value Ref Range   Glucose-Capillary 121 (H) 65 - 99 mg/dL   Comment 1 Notify RN    Comment 2 Document in Chart   Troponin I (q 6hr x 3)     Status: Abnormal   Collection Time: 02/07/16  8:59 PM  Result Value Ref Range   Troponin I 0.04 (HH) <0.03 ng/mL  Glucose, capillary     Status: Abnormal   Collection Time: 02/07/16 11:57 PM  Result Value Ref Range   Glucose-Capillary 105 (H) 65 - 99 mg/dL   Comment 1 Notify RN    Comment 2 Document in Chart   CBC     Status: Abnormal   Collection Time: 02/08/16  2:44 AM  Result Value Ref Range   WBC 5.2 4.0 - 10.5 K/uL   RBC 3.60 (L) 3.87 - 5.11 MIL/uL   Hemoglobin 10.9 (L) 12.0 - 15.0 g/dL   HCT 32.5 (L) 36.0 - 46.0 %   MCV 90.3 78.0 - 100.0 fL   MCH 30.3 26.0 - 34.0 pg   MCHC 33.5 30.0 - 36.0 g/dL   RDW 15.2 11.5 - 15.5 %   Platelets 150 150 - 400 K/uL  Comprehensive metabolic panel     Status: Abnormal   Collection Time: 02/08/16  2:44 AM  Result Value Ref Range   Sodium 140 135 - 145 mmol/L   Potassium 3.3 (L) 3.5 - 5.1 mmol/L   Chloride 110 101 - 111 mmol/L   CO2 22 22 - 32 mmol/L   Glucose, Bld 72 65 - 99 mg/dL   BUN 43 (H) 6 - 20 mg/dL   Creatinine, Ser 1.88 (H) 0.44 - 1.00 mg/dL   Calcium 8.7 (L) 8.9 - 10.3 mg/dL   Total Protein 6.6 6.5 -  8.1 g/dL   Albumin 3.4 (L) 3.5 - 5.0 g/dL   AST 23 15 - 41 U/L   ALT 14 14 - 54 U/L   Alkaline Phosphatase 55 38 - 126 U/L   Total Bilirubin 0.6 0.3 - 1.2 mg/dL   GFR calc non Af Amer 24 (L) >60 mL/min   GFR calc Af Amer 28 (L) >60 mL/min   Anion gap 8 5 - 15  Troponin I     Status: Abnormal   Collection Time: 02/08/16  2:44 AM  Result Value Ref Range   Troponin I 0.05 (HH) <0.03 ng/mL  Glucose, capillary     Status: None   Collection Time: 02/08/16  5:26 AM  Result Value Ref Range   Glucose-Capillary 81 65 - 99 mg/dL   Comment 1 Notify RN    Comment 2 Document in Chart       ABGS No results for input(s): PHART, PO2ART, TCO2, HCO3 in the last 72 hours.  Invalid input(s): PCO2 CULTURES No results found for this or any previous visit (from the past 240 hour(s)). Studies/Results: Ct Abdomen Pelvis Wo Contrast  Result Date: 02/07/2016 CLINICAL DATA:  Weakness, nausea, vomiting and abdominal pain beginning last night. Cough and leg cramping. History of hypertension, diabetes, hypercholesterolemia, chronic renal failure. EXAM: CT ABDOMEN AND PELVIS WITHOUT CONTRAST TECHNIQUE: Multidetector CT imaging of the abdomen and pelvis was performed following the standard protocol without IV contrast. Oral contrast administered. COMPARISON:  CT abdomen and pelvis August 30, 2006 FINDINGS: LOWER CHEST: RIGHT lung base atelectasis/scarring The visualized heart size is normal. Mild coronary artery calcifications. No pericardial effusion. HEPATOBILIARY: Status post cholecystectomy. Normal noncontrast CT appearance of the liver. PANCREAS: Normal. SPLEEN: Multiple punctate splenic granulomas. ADRENALS/URINARY TRACT: Kidneys are orthotopic, demonstrating normal size and morphology. No nephrolithiasis, hydronephrosis; limited assessment for renal masses on this nonenhanced examination. The unopacified ureters are normal in course and caliber. Urinary bladder is partially distended and unremarkable. Normal adrenal glands. STOMACH/BOWEL: Small bowel dilated up to 3.3 cm with air-fluid levels in mid to distal small bowel feces. Transition point LEFT mid abdomen. The stomach, large bowel are normal in course and caliber without inflammatory changes. Enteric contrast has not yet reached the distal small bowel. Moderate amount of retained large bowel stool. Severe sigmoid diverticulosis with a few scattered additional colonic diverticula. Normal appendix. VASCULAR/LYMPHATIC: Aortoiliac vessels are normal in course and caliber, moderate to severe calcific atherosclerosis. No lymphadenopathy by CT size  criteria. REPRODUCTIVE: Status post hysterectomy. OTHER: No intraperitoneal free fluid or free air. MUSCULOSKELETAL: Non-acute. Anterior abdominal wall scarring. Punctate calcification bilateral breast would be better characterized on mammogram as clinically indicated. Minimal grade 1 L4-5 anterolisthesis. Severe L1-2 thru L4-5 degenerative discs with multilevel severe facet arthropathy. IMPRESSION: Early versus partial small bowel obstruction, likely secondary to adhesions with transition point LEFT abdomen. Severe sigmoid diverticulosis. Electronically Signed   By: Elon Alas M.D.   On: 02/07/2016 18:50   Dg Chest 2 View  Result Date: 02/07/2016 CLINICAL DATA:  Weakness nausea vomiting and abdominal pain EXAM: CHEST  2 VIEW COMPARISON:  02/17/2007 FINDINGS: Small focus of atelectasis at the lingula. No acute consolidation or effusion. Stable borderline enlarged cardiomediastinal silhouette with atherosclerosis. No pneumothorax. IMPRESSION: Minimal atelectasis at the lingula. Otherwise no radiographic evidence for acute cardiopulmonary abnormality Electronically Signed   By: Donavan Foil M.D.   On: 02/07/2016 18:29   Micro Results: No results found for this or any previous visit (from the past 240 hour(s)). Studies/Results: Ct  Abdomen Pelvis Wo Contrast  Result Date: 02/07/2016 CLINICAL DATA:  Weakness, nausea, vomiting and abdominal pain beginning last night. Cough and leg cramping. History of hypertension, diabetes, hypercholesterolemia, chronic renal failure. EXAM: CT ABDOMEN AND PELVIS WITHOUT CONTRAST TECHNIQUE: Multidetector CT imaging of the abdomen and pelvis was performed following the standard protocol without IV contrast. Oral contrast administered. COMPARISON:  CT abdomen and pelvis August 30, 2006 FINDINGS: LOWER CHEST: RIGHT lung base atelectasis/scarring The visualized heart size is normal. Mild coronary artery calcifications. No pericardial effusion. HEPATOBILIARY: Status post  cholecystectomy. Normal noncontrast CT appearance of the liver. PANCREAS: Normal. SPLEEN: Multiple punctate splenic granulomas. ADRENALS/URINARY TRACT: Kidneys are orthotopic, demonstrating normal size and morphology. No nephrolithiasis, hydronephrosis; limited assessment for renal masses on this nonenhanced examination. The unopacified ureters are normal in course and caliber. Urinary bladder is partially distended and unremarkable. Normal adrenal glands. STOMACH/BOWEL: Small bowel dilated up to 3.3 cm with air-fluid levels in mid to distal small bowel feces. Transition point LEFT mid abdomen. The stomach, large bowel are normal in course and caliber without inflammatory changes. Enteric contrast has not yet reached the distal small bowel. Moderate amount of retained large bowel stool. Severe sigmoid diverticulosis with a few scattered additional colonic diverticula. Normal appendix. VASCULAR/LYMPHATIC: Aortoiliac vessels are normal in course and caliber, moderate to severe calcific atherosclerosis. No lymphadenopathy by CT size criteria. REPRODUCTIVE: Status post hysterectomy. OTHER: No intraperitoneal free fluid or free air. MUSCULOSKELETAL: Non-acute. Anterior abdominal wall scarring. Punctate calcification bilateral breast would be better characterized on mammogram as clinically indicated. Minimal grade 1 L4-5 anterolisthesis. Severe L1-2 thru L4-5 degenerative discs with multilevel severe facet arthropathy. IMPRESSION: Early versus partial small bowel obstruction, likely secondary to adhesions with transition point LEFT abdomen. Severe sigmoid diverticulosis. Electronically Signed   By: Elon Alas M.D.   On: 02/07/2016 18:50   Dg Chest 2 View  Result Date: 02/07/2016 CLINICAL DATA:  Weakness nausea vomiting and abdominal pain EXAM: CHEST  2 VIEW COMPARISON:  02/17/2007 FINDINGS: Small focus of atelectasis at the lingula. No acute consolidation or effusion. Stable borderline enlarged  cardiomediastinal silhouette with atherosclerosis. No pneumothorax. IMPRESSION: Minimal atelectasis at the lingula. Otherwise no radiographic evidence for acute cardiopulmonary abnormality Electronically Signed   By: Donavan Foil M.D.   On: 02/07/2016 18:29   Medications:  I have reviewed the patient's current medications Scheduled Meds: . amLODipine  5 mg Oral Daily  . atenolol  25 mg Oral Daily  . cefTRIAXone (ROCEPHIN)  IV  1 g Intravenous Q24H  . enoxaparin (LOVENOX) injection  30 mg Subcutaneous Q24H  . insulin aspart  0-9 Units Subcutaneous TID WC  . pantoprazole  40 mg Oral Daily  . potassium chloride (KCL MULTIRUN) 30 mEq in 265 mL IVPB  30 mEq Intravenous Once  . sertraline  100 mg Oral Daily   Continuous Infusions: . sodium chloride 75 mL/hr at 02/07/16 2211   PRN Meds:.acetaminophen **OR** acetaminophen, ondansetron **OR** ondansetron (ZOFRAN) IV   Assessment/Plan: #1. Partial small bowel obstruction. She had a bowel movement last night and this morning. Symptoms are resolved. Advance diet to clear liquids. Continue observation. #2. Hypokalemia. 3.3. Replace intravenously. #3. Mild troponin elevations. Likely demand ischemia. EKG reveals nonspecific ST-T wave changes and normal sinus rhythm. Cancel echo and cardiology consult. She has had no symptoms of chest pain. #4. Hypertension. Continue amlodipine and atenolol. Hold chlorthalidone for now. #5. Diabetes. Treat with sliding scale NovoLog. Hold glimepiride and pioglitazone. Glucose is 72. #6. Chronic kidney disease stage IV.  Stable. Creatinine 1.88. #7. UTI. She has a history of recurrent UTIs and is on suppressive therapy with trimethoprim. Urine culture is pending. She has been started on Rocephin. Active Problems:   Essential hypertension   Small bowel obstruction   Elevated troponin     LOS: 1 day   Tammy Dyer 02/08/2016, 7:29 AM

## 2016-02-09 LAB — GLUCOSE, CAPILLARY
GLUCOSE-CAPILLARY: 91 mg/dL (ref 65–99)
Glucose-Capillary: 206 mg/dL — ABNORMAL HIGH (ref 65–99)

## 2016-02-09 LAB — URINE CULTURE

## 2016-02-09 LAB — BASIC METABOLIC PANEL
ANION GAP: 9 (ref 5–15)
BUN: 30 mg/dL — ABNORMAL HIGH (ref 6–20)
CALCIUM: 8.7 mg/dL — AB (ref 8.9–10.3)
CO2: 21 mmol/L — ABNORMAL LOW (ref 22–32)
Chloride: 110 mmol/L (ref 101–111)
Creatinine, Ser: 1.72 mg/dL — ABNORMAL HIGH (ref 0.44–1.00)
GFR, EST AFRICAN AMERICAN: 31 mL/min — AB (ref >60)
GFR, EST NON AFRICAN AMERICAN: 27 mL/min — AB (ref >60)
GLUCOSE: 90 mg/dL (ref 65–99)
POTASSIUM: 3.7 mmol/L (ref 3.5–5.1)
SODIUM: 140 mmol/L (ref 135–145)

## 2016-02-09 NOTE — Progress Notes (Signed)
Patient discharging home.  IV removed - WNL.  Reviewed DC instructions and medications.  Instructed to follow up with PCP in 1 week.  No questions at this time.  Awaiting arrival of family for ride home.  Patient in NAD. Verbalizes understanding of DC instructions.

## 2016-02-09 NOTE — Discharge Summary (Signed)
Physician Discharge Summary  Tammy Dyer YHO:[id number] DOB: [birth year] DOA: 02/07/2016   Admit date: 02/07/2016 Discharge date: 02/09/2016  Discharge Diagnoses:  Active Problems:   Essential hypertension   SBO (small bowel obstruction)   Small bowel obstruction   Elevated troponin    Wt Readings from Last 3 Encounters:  02/07/16 154 lb 15.7 oz (70.3 kg)  10/18/15 162 lb (73.5 kg)  04/19/15 162 lb 12.8 oz (73.8 kg)     Hospital Course:  This patient is an 83 year old female who presented with nausea, vomiting and abdominal pain. She underwent CT scan of the abdomen and pelvis which revealed an early partial small bowel obstruction. There was no fever or leukocytosis. She was mildly hypokalemic. She was treated with IV fluids. Nasogastric suctioning was refused. Her vomiting and abdominal pain resolved. She was given a trial of clear liquids which she tolerated well. Her diet was advanced without complication. Potassium was supplemented to the normal range. She had some mildly elevated troponin levels which were felt to be related to demand ischemia. Diabetes control has been stable. She has stable chronic kidney disease stage IV.  Urinalysis was consistent with a possible UTI however she was asymptomatic. She has a history of recurrent UTIs and is on antibiotic suppression with trimethoprim. Culture remains pending. She has been treated with Rocephin.  She is much improved and stable for discharge on the morning of 1/18. Her exam reveals clear lungs. Heart regular with no murmurs. Abdomen is soft and nontender with no hepatosplenomegaly.  She'll be seen in follow-up in office in one week. Etiology of her presentation may well be adhesions. Will pursue surgical consultation if symptoms recur.   Discharge Instructions  Discharge Instructions    Discharge instructions    Complete by:  As directed    Discharge instructions    Complete by:  As directed      Allergies as of  02/09/2016      Reactions   Codeine    REACTION: head ache   Sulfonamide Derivatives    REACTION: yeast infection      Medication List    TAKE these medications   acetaminophen 500 MG tablet Commonly known as:  TYLENOL Take 500 mg by mouth every 6 (six) hours as needed for mild pain or moderate pain.   amLODipine 5 MG tablet Commonly known as:  NORVASC Take 5 mg by mouth daily.   aspirin 81 MG tablet Take 81 mg by mouth daily.   atenolol 25 MG tablet Commonly known as:  TENORMIN Take 25 mg by mouth daily.   atorvastatin 20 MG tablet Commonly known as:  LIPITOR Take 20 mg by mouth every evening.   benzonatate 200 MG capsule Commonly known as:  TESSALON Take 200 mg by mouth 3 (three) times daily as needed for cough.   BLUE-EMU SUPER STRENGTH Crea Apply 1 application topically daily as needed (to arm(s) for pain).   chlorthalidone 25 MG tablet Commonly known as:  HYGROTON Take 25 mg by mouth daily.   glimepiride 2 MG tablet Commonly known as:  AMARYL Take 2 mg by mouth daily before breakfast.   Melatonin 10 MG Tabs Take 10 mg by mouth at bedtime.   omeprazole 20 MG capsule Commonly known as:  PRILOSEC Take 1 capsule (20 mg total) by mouth daily.   pioglitazone 30 MG tablet Commonly known as:  ACTOS Take 30 mg by mouth daily.   sertraline 100 MG tablet Commonly known as:  ZOLOFT Take 100  mg by mouth daily.   trimethoprim 100 MG tablet Commonly known as:  TRIMPEX Take 100 mg by mouth daily.        Tammy Dyer 02/09/2016

## 2016-02-17 DIAGNOSIS — Z6830 Body mass index (BMI) 30.0-30.9, adult: Secondary | ICD-10-CM | POA: Diagnosis not present

## 2016-02-17 DIAGNOSIS — I700 Atherosclerosis of aorta: Secondary | ICD-10-CM | POA: Diagnosis not present

## 2016-03-01 ENCOUNTER — Encounter: Payer: Self-pay | Admitting: Internal Medicine

## 2016-03-15 ENCOUNTER — Ambulatory Visit (INDEPENDENT_AMBULATORY_CARE_PROVIDER_SITE_OTHER): Payer: PPO | Admitting: Otolaryngology

## 2016-03-15 DIAGNOSIS — H9313 Tinnitus, bilateral: Secondary | ICD-10-CM

## 2016-03-15 DIAGNOSIS — H903 Sensorineural hearing loss, bilateral: Secondary | ICD-10-CM

## 2016-04-24 DIAGNOSIS — N182 Chronic kidney disease, stage 2 (mild): Secondary | ICD-10-CM | POA: Diagnosis not present

## 2016-04-24 DIAGNOSIS — I10 Essential (primary) hypertension: Secondary | ICD-10-CM | POA: Diagnosis not present

## 2016-04-24 DIAGNOSIS — E119 Type 2 diabetes mellitus without complications: Secondary | ICD-10-CM | POA: Diagnosis not present

## 2016-04-24 DIAGNOSIS — Z79899 Other long term (current) drug therapy: Secondary | ICD-10-CM | POA: Diagnosis not present

## 2016-04-24 DIAGNOSIS — E785 Hyperlipidemia, unspecified: Secondary | ICD-10-CM | POA: Diagnosis not present

## 2016-04-27 DIAGNOSIS — N184 Chronic kidney disease, stage 4 (severe): Secondary | ICD-10-CM | POA: Diagnosis not present

## 2016-04-27 DIAGNOSIS — I10 Essential (primary) hypertension: Secondary | ICD-10-CM | POA: Diagnosis not present

## 2016-04-27 DIAGNOSIS — E1122 Type 2 diabetes mellitus with diabetic chronic kidney disease: Secondary | ICD-10-CM | POA: Diagnosis not present

## 2016-08-03 DIAGNOSIS — E1129 Type 2 diabetes mellitus with other diabetic kidney complication: Secondary | ICD-10-CM | POA: Diagnosis not present

## 2016-08-03 DIAGNOSIS — I10 Essential (primary) hypertension: Secondary | ICD-10-CM | POA: Diagnosis not present

## 2016-08-03 DIAGNOSIS — Z79899 Other long term (current) drug therapy: Secondary | ICD-10-CM | POA: Diagnosis not present

## 2016-08-03 DIAGNOSIS — N184 Chronic kidney disease, stage 4 (severe): Secondary | ICD-10-CM | POA: Diagnosis not present

## 2016-08-03 DIAGNOSIS — E119 Type 2 diabetes mellitus without complications: Secondary | ICD-10-CM | POA: Diagnosis not present

## 2016-08-21 ENCOUNTER — Other Ambulatory Visit (HOSPITAL_COMMUNITY): Payer: Self-pay | Admitting: Internal Medicine

## 2016-08-21 ENCOUNTER — Ambulatory Visit (HOSPITAL_COMMUNITY)
Admission: RE | Admit: 2016-08-21 | Discharge: 2016-08-21 | Disposition: A | Discharge status: Home / Self Care | Payer: PPO | Source: Ambulatory Visit | Attending: Internal Medicine | Admitting: Internal Medicine

## 2016-08-21 DIAGNOSIS — M25512 Pain in left shoulder: Secondary | ICD-10-CM

## 2016-08-21 DIAGNOSIS — R609 Edema, unspecified: Secondary | ICD-10-CM | POA: Diagnosis not present

## 2016-08-21 DIAGNOSIS — E1122 Type 2 diabetes mellitus with diabetic chronic kidney disease: Secondary | ICD-10-CM | POA: Diagnosis not present

## 2016-08-21 DIAGNOSIS — N184 Chronic kidney disease, stage 4 (severe): Secondary | ICD-10-CM | POA: Diagnosis not present

## 2016-09-10 DIAGNOSIS — D3131 Benign neoplasm of right choroid: Secondary | ICD-10-CM | POA: Diagnosis not present

## 2016-09-10 DIAGNOSIS — H5202 Hypermetropia, left eye: Secondary | ICD-10-CM | POA: Diagnosis not present

## 2016-09-10 DIAGNOSIS — H52223 Regular astigmatism, bilateral: Secondary | ICD-10-CM | POA: Diagnosis not present

## 2016-09-10 DIAGNOSIS — H524 Presbyopia: Secondary | ICD-10-CM | POA: Diagnosis not present

## 2016-11-30 DIAGNOSIS — Z79899 Other long term (current) drug therapy: Secondary | ICD-10-CM | POA: Diagnosis not present

## 2016-11-30 DIAGNOSIS — N184 Chronic kidney disease, stage 4 (severe): Secondary | ICD-10-CM | POA: Diagnosis not present

## 2016-11-30 DIAGNOSIS — E119 Type 2 diabetes mellitus without complications: Secondary | ICD-10-CM | POA: Diagnosis not present

## 2016-12-24 DIAGNOSIS — Z23 Encounter for immunization: Secondary | ICD-10-CM | POA: Diagnosis not present

## 2016-12-24 DIAGNOSIS — I10 Essential (primary) hypertension: Secondary | ICD-10-CM | POA: Diagnosis not present

## 2016-12-24 DIAGNOSIS — N184 Chronic kidney disease, stage 4 (severe): Secondary | ICD-10-CM | POA: Diagnosis not present

## 2016-12-24 DIAGNOSIS — E1122 Type 2 diabetes mellitus with diabetic chronic kidney disease: Secondary | ICD-10-CM | POA: Diagnosis not present

## 2017-05-10 DIAGNOSIS — E1129 Type 2 diabetes mellitus with other diabetic kidney complication: Secondary | ICD-10-CM | POA: Diagnosis not present

## 2017-05-10 DIAGNOSIS — Z79899 Other long term (current) drug therapy: Secondary | ICD-10-CM | POA: Diagnosis not present

## 2017-05-10 DIAGNOSIS — N184 Chronic kidney disease, stage 4 (severe): Secondary | ICD-10-CM | POA: Diagnosis not present

## 2017-05-10 DIAGNOSIS — I10 Essential (primary) hypertension: Secondary | ICD-10-CM | POA: Diagnosis not present

## 2017-05-17 DIAGNOSIS — E785 Hyperlipidemia, unspecified: Secondary | ICD-10-CM | POA: Diagnosis not present

## 2017-05-17 DIAGNOSIS — E1122 Type 2 diabetes mellitus with diabetic chronic kidney disease: Secondary | ICD-10-CM | POA: Diagnosis not present

## 2017-05-17 DIAGNOSIS — Z6828 Body mass index (BMI) 28.0-28.9, adult: Secondary | ICD-10-CM | POA: Diagnosis not present

## 2017-05-17 DIAGNOSIS — I129 Hypertensive chronic kidney disease with stage 1 through stage 4 chronic kidney disease, or unspecified chronic kidney disease: Secondary | ICD-10-CM | POA: Diagnosis not present

## 2017-09-13 DIAGNOSIS — N184 Chronic kidney disease, stage 4 (severe): Secondary | ICD-10-CM | POA: Diagnosis not present

## 2017-09-13 DIAGNOSIS — E785 Hyperlipidemia, unspecified: Secondary | ICD-10-CM | POA: Diagnosis not present

## 2017-09-13 DIAGNOSIS — Z79899 Other long term (current) drug therapy: Secondary | ICD-10-CM | POA: Diagnosis not present

## 2017-09-13 DIAGNOSIS — E1129 Type 2 diabetes mellitus with other diabetic kidney complication: Secondary | ICD-10-CM | POA: Diagnosis not present

## 2017-09-20 DIAGNOSIS — N184 Chronic kidney disease, stage 4 (severe): Secondary | ICD-10-CM | POA: Diagnosis not present

## 2017-09-20 DIAGNOSIS — I129 Hypertensive chronic kidney disease with stage 1 through stage 4 chronic kidney disease, or unspecified chronic kidney disease: Secondary | ICD-10-CM | POA: Diagnosis not present

## 2017-09-20 DIAGNOSIS — E1122 Type 2 diabetes mellitus with diabetic chronic kidney disease: Secondary | ICD-10-CM | POA: Diagnosis not present

## 2017-09-28 IMAGING — US US EXTREM LOW*L* LIMITED
1 series · 9 of 9 positions shown · non-contrast
Comparison: None

CLINICAL DATA: Left Baker cyst. Sudden onset of severe left
posterior knee pain for 6 days.

EXAM:
ULTRASOUND LEFT LOWER EXTREMITY LIMITED
TECHNIQUE: Ultrasound examination of the lower extremity soft tissues was
performed in the area of clinical concern.

[Series 1: us extrem low*left* limited · 0.09mm/px · 9 of 9 slices shown]
[im 1/9]
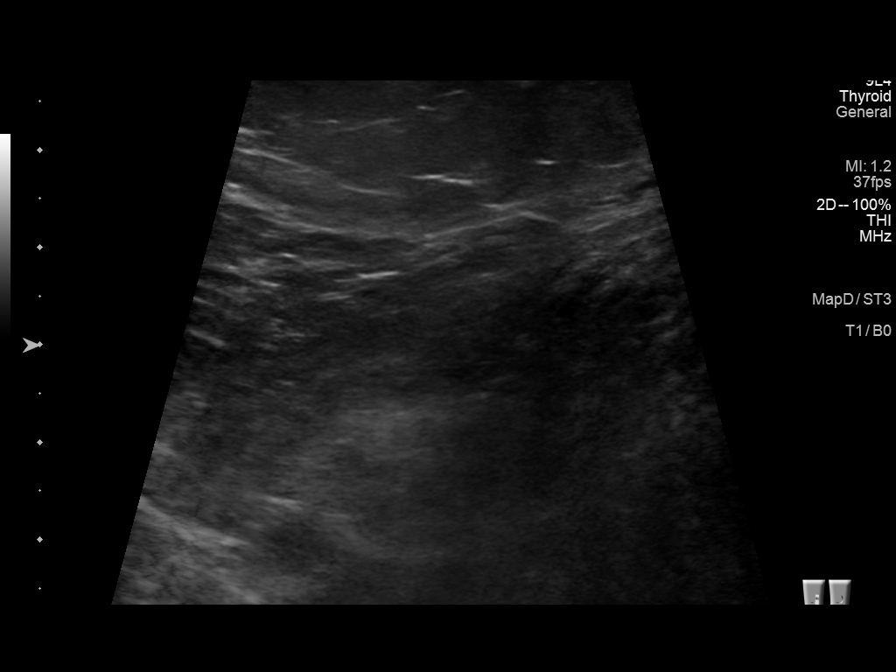
[im 2/9]
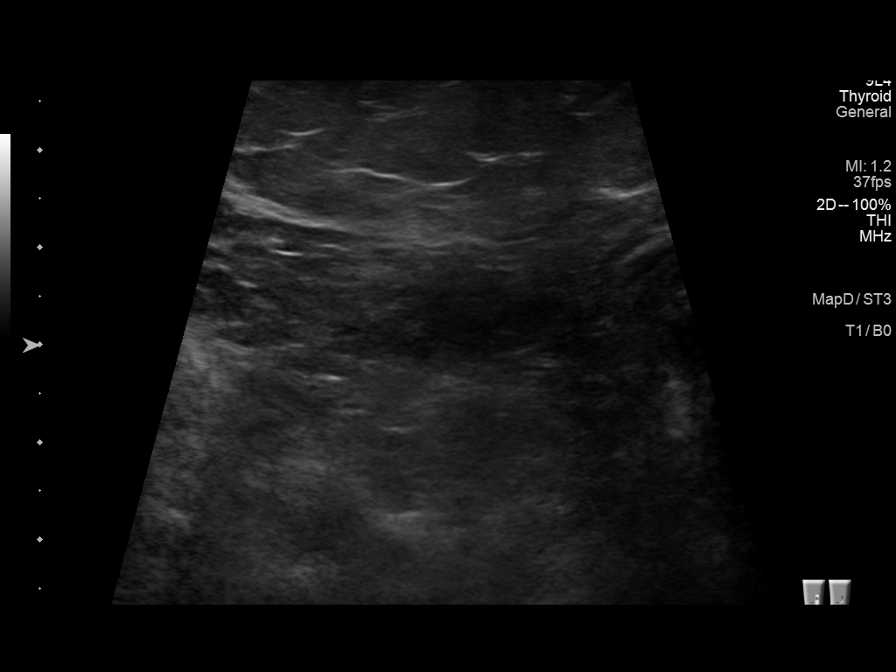
[im 3/9]
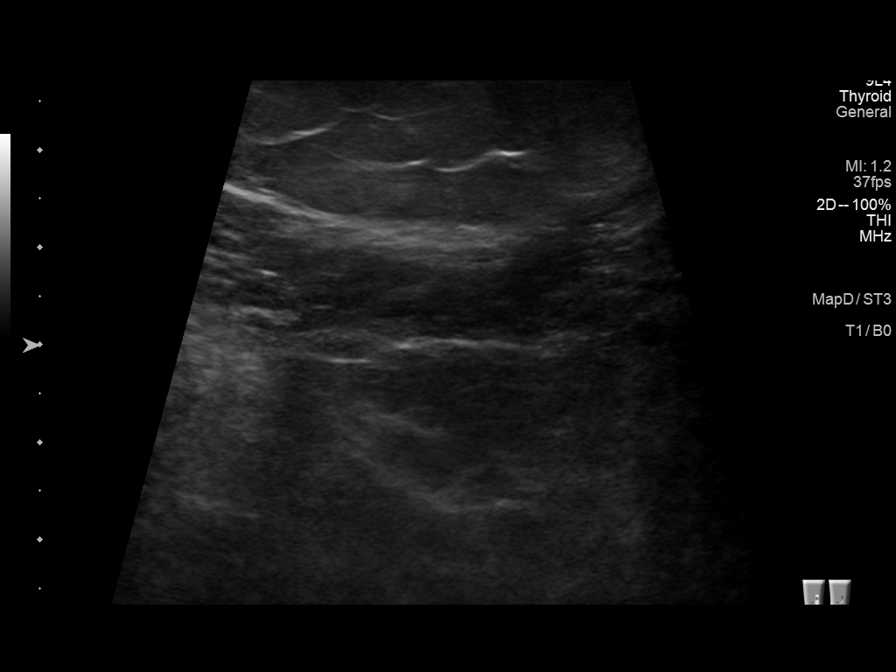
[im 4/9]
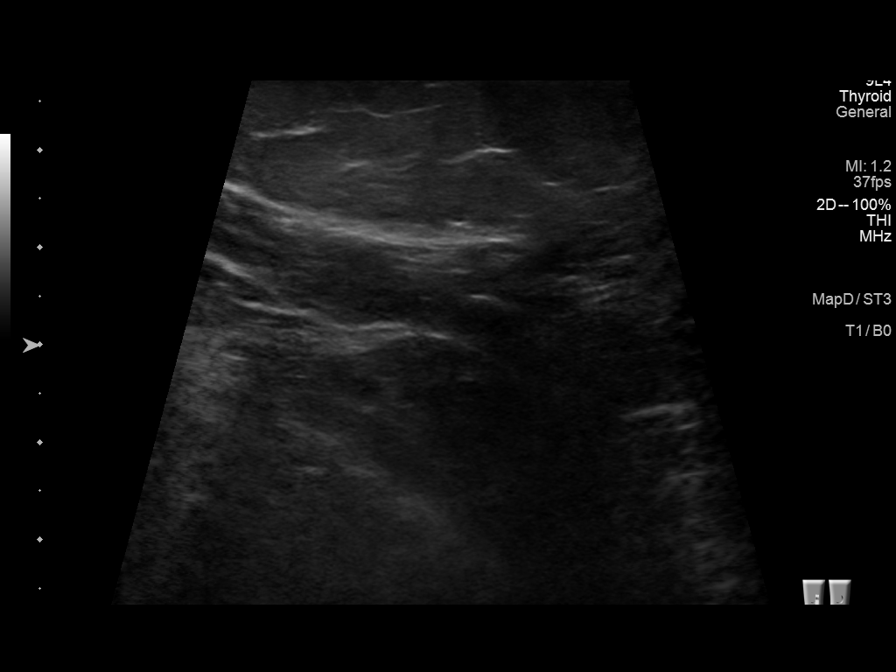
[im 5/9]
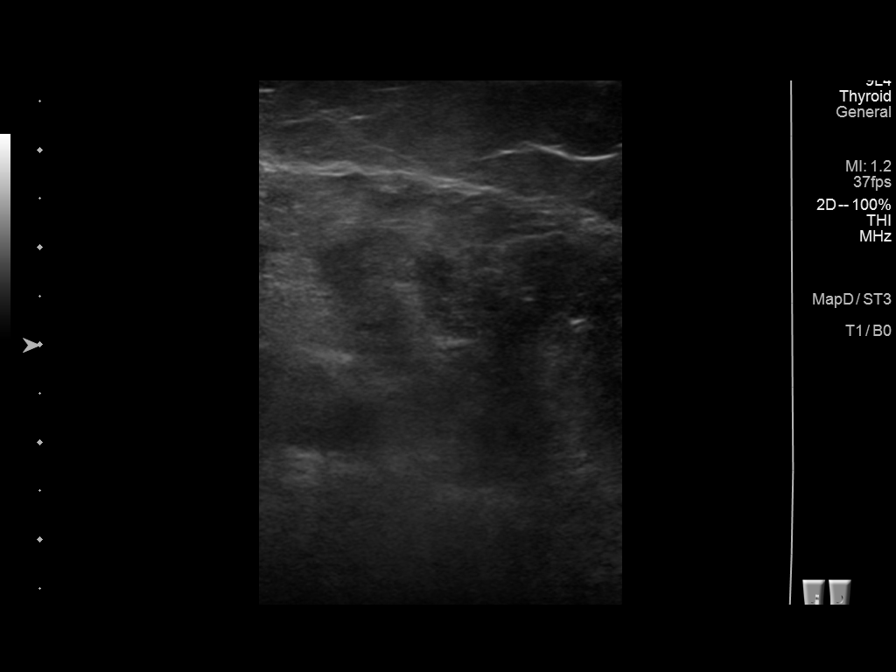
[im 6/9]
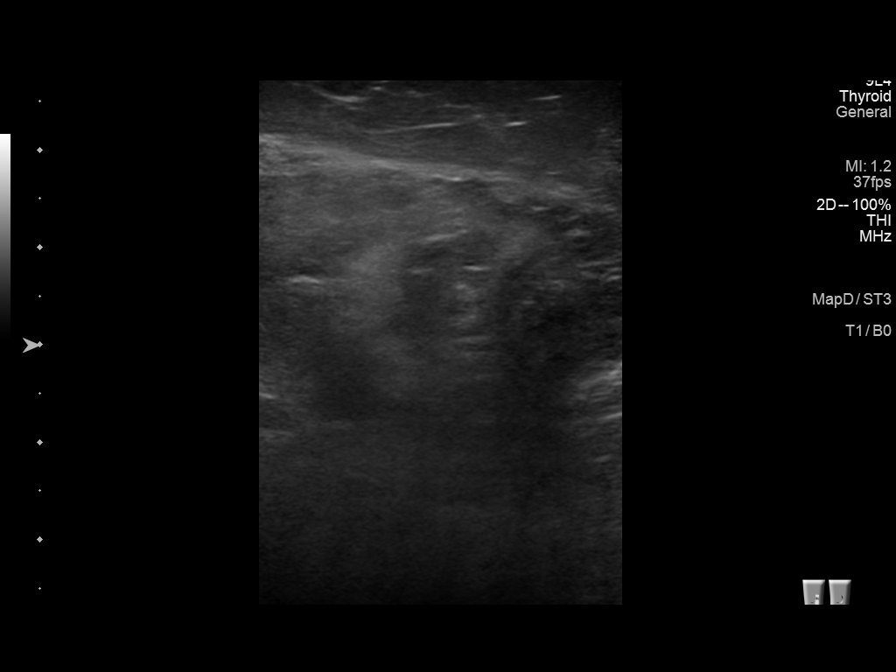
[im 7/9]
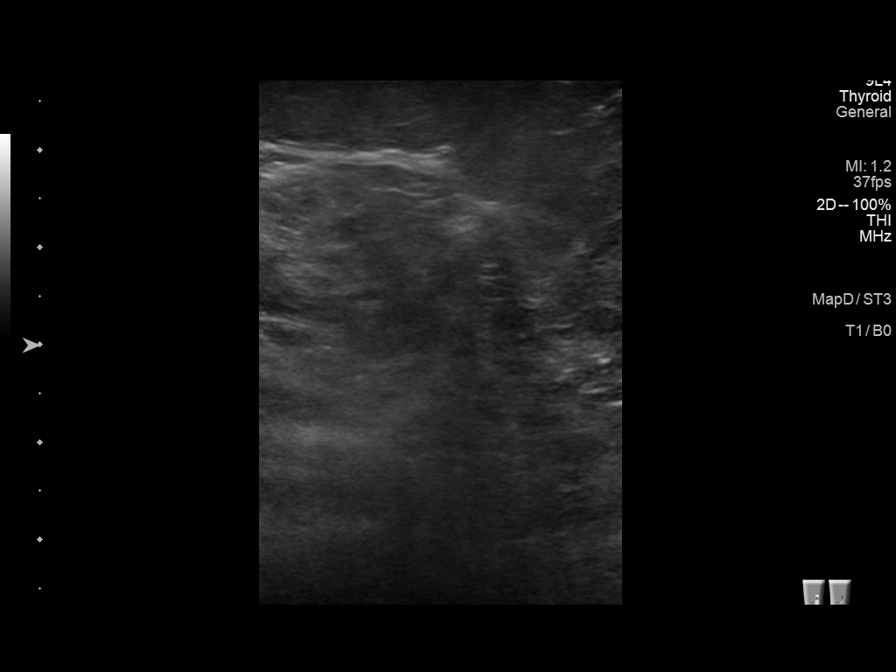
[im 8/9]
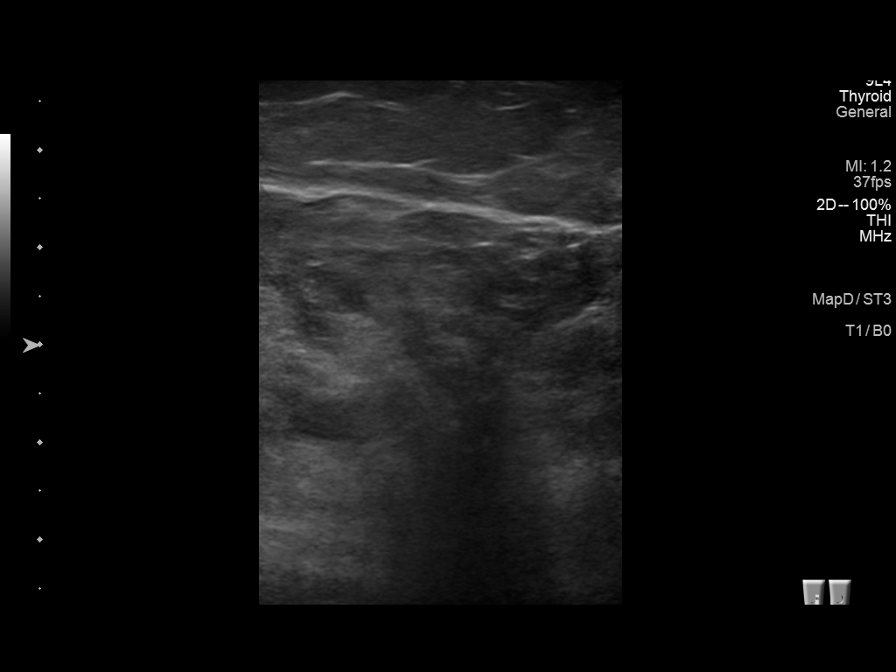
[im 9/9]
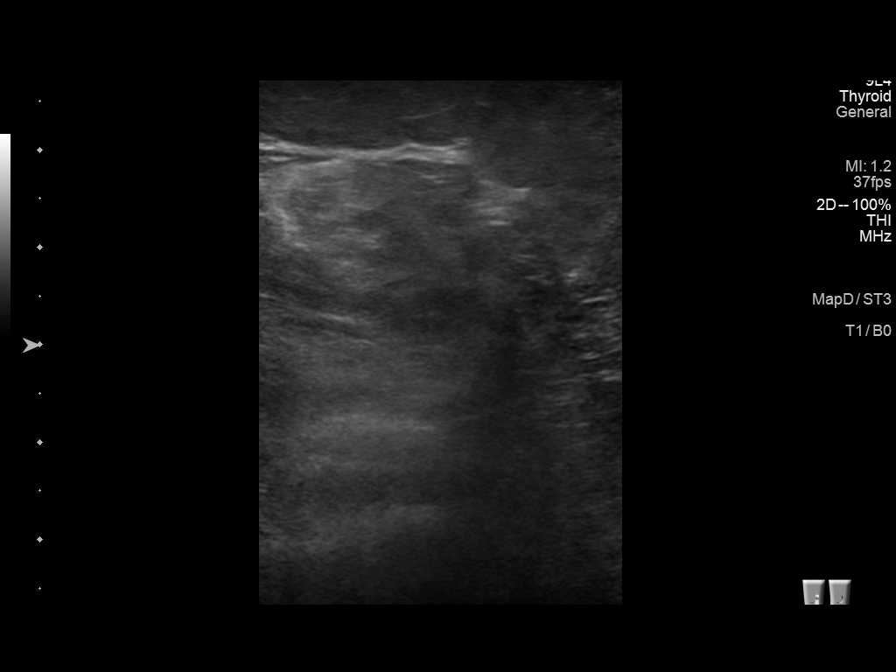

[9 of 9 positions shown; findings below may reference images not displayed]

FINDINGS: Ultrasound was performed in the left popliteal fossa. No soft tissue
mass or cystic area. No fluid collection. No soft tissue
abnormality.
IMPRESSION: No evidence of popliteal fossa cyst or other abnormality.

## 2017-10-08 DIAGNOSIS — K5792 Diverticulitis of intestine, part unspecified, without perforation or abscess without bleeding: Secondary | ICD-10-CM | POA: Diagnosis not present

## 2017-11-22 DIAGNOSIS — Z23 Encounter for immunization: Secondary | ICD-10-CM | POA: Diagnosis not present

## 2017-12-27 DIAGNOSIS — N184 Chronic kidney disease, stage 4 (severe): Secondary | ICD-10-CM | POA: Diagnosis not present

## 2017-12-27 DIAGNOSIS — Z79899 Other long term (current) drug therapy: Secondary | ICD-10-CM | POA: Diagnosis not present

## 2017-12-27 DIAGNOSIS — E1129 Type 2 diabetes mellitus with other diabetic kidney complication: Secondary | ICD-10-CM | POA: Diagnosis not present

## 2017-12-27 DIAGNOSIS — I10 Essential (primary) hypertension: Secondary | ICD-10-CM | POA: Diagnosis not present

## 2018-01-03 DIAGNOSIS — N184 Chronic kidney disease, stage 4 (severe): Secondary | ICD-10-CM | POA: Diagnosis not present

## 2018-01-03 DIAGNOSIS — E1122 Type 2 diabetes mellitus with diabetic chronic kidney disease: Secondary | ICD-10-CM | POA: Diagnosis not present

## 2018-01-03 DIAGNOSIS — I10 Essential (primary) hypertension: Secondary | ICD-10-CM | POA: Diagnosis not present

## 2018-02-12 DIAGNOSIS — H43813 Vitreous degeneration, bilateral: Secondary | ICD-10-CM | POA: Diagnosis not present

## 2018-02-12 DIAGNOSIS — Z9849 Cataract extraction status, unspecified eye: Secondary | ICD-10-CM | POA: Diagnosis not present

## 2018-02-12 DIAGNOSIS — Z961 Presence of intraocular lens: Secondary | ICD-10-CM | POA: Diagnosis not present

## 2018-02-12 DIAGNOSIS — D3131 Benign neoplasm of right choroid: Secondary | ICD-10-CM | POA: Diagnosis not present

## 2018-03-07 DIAGNOSIS — R05 Cough: Secondary | ICD-10-CM | POA: Diagnosis not present

## 2018-05-13 DIAGNOSIS — E1129 Type 2 diabetes mellitus with other diabetic kidney complication: Secondary | ICD-10-CM | POA: Diagnosis not present

## 2018-05-13 DIAGNOSIS — I10 Essential (primary) hypertension: Secondary | ICD-10-CM | POA: Diagnosis not present

## 2018-05-13 DIAGNOSIS — N184 Chronic kidney disease, stage 4 (severe): Secondary | ICD-10-CM | POA: Diagnosis not present

## 2018-05-13 DIAGNOSIS — Z79899 Other long term (current) drug therapy: Secondary | ICD-10-CM | POA: Diagnosis not present

## 2018-05-16 DIAGNOSIS — E1122 Type 2 diabetes mellitus with diabetic chronic kidney disease: Secondary | ICD-10-CM | POA: Diagnosis not present

## 2018-05-16 DIAGNOSIS — F3289 Other specified depressive episodes: Secondary | ICD-10-CM | POA: Diagnosis not present

## 2018-05-16 DIAGNOSIS — N184 Chronic kidney disease, stage 4 (severe): Secondary | ICD-10-CM | POA: Diagnosis not present

## 2018-09-19 DIAGNOSIS — E1129 Type 2 diabetes mellitus with other diabetic kidney complication: Secondary | ICD-10-CM | POA: Diagnosis not present

## 2018-09-19 DIAGNOSIS — Z79899 Other long term (current) drug therapy: Secondary | ICD-10-CM | POA: Diagnosis not present

## 2018-09-19 DIAGNOSIS — I10 Essential (primary) hypertension: Secondary | ICD-10-CM | POA: Diagnosis not present

## 2018-09-19 DIAGNOSIS — E785 Hyperlipidemia, unspecified: Secondary | ICD-10-CM | POA: Diagnosis not present

## 2018-09-19 DIAGNOSIS — N184 Chronic kidney disease, stage 4 (severe): Secondary | ICD-10-CM | POA: Diagnosis not present

## 2018-09-26 DIAGNOSIS — N390 Urinary tract infection, site not specified: Secondary | ICD-10-CM | POA: Diagnosis not present

## 2018-09-26 DIAGNOSIS — E785 Hyperlipidemia, unspecified: Secondary | ICD-10-CM | POA: Diagnosis not present

## 2018-09-26 DIAGNOSIS — N184 Chronic kidney disease, stage 4 (severe): Secondary | ICD-10-CM | POA: Diagnosis not present

## 2018-09-26 DIAGNOSIS — E1122 Type 2 diabetes mellitus with diabetic chronic kidney disease: Secondary | ICD-10-CM | POA: Diagnosis not present

## 2018-11-14 DIAGNOSIS — Z23 Encounter for immunization: Secondary | ICD-10-CM | POA: Diagnosis not present

## 2018-12-16 DIAGNOSIS — Z79899 Other long term (current) drug therapy: Secondary | ICD-10-CM | POA: Diagnosis not present

## 2018-12-16 DIAGNOSIS — E1129 Type 2 diabetes mellitus with other diabetic kidney complication: Secondary | ICD-10-CM | POA: Diagnosis not present

## 2018-12-16 DIAGNOSIS — N184 Chronic kidney disease, stage 4 (severe): Secondary | ICD-10-CM | POA: Diagnosis not present

## 2018-12-16 DIAGNOSIS — E785 Hyperlipidemia, unspecified: Secondary | ICD-10-CM | POA: Diagnosis not present

## 2018-12-26 DIAGNOSIS — E1122 Type 2 diabetes mellitus with diabetic chronic kidney disease: Secondary | ICD-10-CM | POA: Diagnosis not present

## 2018-12-26 DIAGNOSIS — N184 Chronic kidney disease, stage 4 (severe): Secondary | ICD-10-CM | POA: Diagnosis not present

## 2018-12-26 DIAGNOSIS — N3020 Other chronic cystitis without hematuria: Secondary | ICD-10-CM | POA: Diagnosis not present

## 2019-03-20 DIAGNOSIS — Z79899 Other long term (current) drug therapy: Secondary | ICD-10-CM | POA: Diagnosis not present

## 2019-03-20 DIAGNOSIS — N184 Chronic kidney disease, stage 4 (severe): Secondary | ICD-10-CM | POA: Diagnosis not present

## 2019-03-20 DIAGNOSIS — E1129 Type 2 diabetes mellitus with other diabetic kidney complication: Secondary | ICD-10-CM | POA: Diagnosis not present

## 2019-03-26 DIAGNOSIS — E1122 Type 2 diabetes mellitus with diabetic chronic kidney disease: Secondary | ICD-10-CM | POA: Diagnosis not present

## 2019-03-26 DIAGNOSIS — N184 Chronic kidney disease, stage 4 (severe): Secondary | ICD-10-CM | POA: Diagnosis not present

## 2019-03-26 DIAGNOSIS — F330 Major depressive disorder, recurrent, mild: Secondary | ICD-10-CM | POA: Diagnosis not present

## 2019-06-25 DIAGNOSIS — Z79899 Other long term (current) drug therapy: Secondary | ICD-10-CM | POA: Diagnosis not present

## 2019-06-25 DIAGNOSIS — N184 Chronic kidney disease, stage 4 (severe): Secondary | ICD-10-CM | POA: Diagnosis not present

## 2019-06-25 DIAGNOSIS — E1129 Type 2 diabetes mellitus with other diabetic kidney complication: Secondary | ICD-10-CM | POA: Diagnosis not present

## 2019-06-25 DIAGNOSIS — F329 Major depressive disorder, single episode, unspecified: Secondary | ICD-10-CM | POA: Diagnosis not present

## 2019-07-03 DIAGNOSIS — I10 Essential (primary) hypertension: Secondary | ICD-10-CM | POA: Diagnosis not present

## 2019-07-03 DIAGNOSIS — Z6828 Body mass index (BMI) 28.0-28.9, adult: Secondary | ICD-10-CM | POA: Diagnosis not present

## 2019-07-03 DIAGNOSIS — N184 Chronic kidney disease, stage 4 (severe): Secondary | ICD-10-CM | POA: Diagnosis not present

## 2019-07-03 DIAGNOSIS — E1122 Type 2 diabetes mellitus with diabetic chronic kidney disease: Secondary | ICD-10-CM | POA: Diagnosis not present

## 2019-07-14 DIAGNOSIS — E113213 Type 2 diabetes mellitus with mild nonproliferative diabetic retinopathy with macular edema, bilateral: Secondary | ICD-10-CM | POA: Diagnosis not present

## 2019-09-24 DIAGNOSIS — Z9181 History of falling: Secondary | ICD-10-CM | POA: Diagnosis not present

## 2019-09-24 DIAGNOSIS — S0181XA Laceration without foreign body of other part of head, initial encounter: Secondary | ICD-10-CM | POA: Diagnosis not present

## 2019-10-02 DIAGNOSIS — I10 Essential (primary) hypertension: Secondary | ICD-10-CM | POA: Diagnosis not present

## 2019-10-02 DIAGNOSIS — N184 Chronic kidney disease, stage 4 (severe): Secondary | ICD-10-CM | POA: Diagnosis not present

## 2019-10-02 DIAGNOSIS — E1129 Type 2 diabetes mellitus with other diabetic kidney complication: Secondary | ICD-10-CM | POA: Diagnosis not present

## 2019-10-02 DIAGNOSIS — Z79899 Other long term (current) drug therapy: Secondary | ICD-10-CM | POA: Diagnosis not present

## 2019-10-09 DIAGNOSIS — N184 Chronic kidney disease, stage 4 (severe): Secondary | ICD-10-CM | POA: Diagnosis not present

## 2019-10-09 DIAGNOSIS — E1129 Type 2 diabetes mellitus with other diabetic kidney complication: Secondary | ICD-10-CM | POA: Diagnosis not present

## 2019-10-09 DIAGNOSIS — F329 Major depressive disorder, single episode, unspecified: Secondary | ICD-10-CM | POA: Diagnosis not present

## 2019-10-30 DIAGNOSIS — Z23 Encounter for immunization: Secondary | ICD-10-CM | POA: Diagnosis not present

## 2020-01-11 DIAGNOSIS — E1129 Type 2 diabetes mellitus with other diabetic kidney complication: Secondary | ICD-10-CM | POA: Diagnosis not present

## 2020-01-11 DIAGNOSIS — N184 Chronic kidney disease, stage 4 (severe): Secondary | ICD-10-CM | POA: Diagnosis not present

## 2020-01-11 DIAGNOSIS — Z79899 Other long term (current) drug therapy: Secondary | ICD-10-CM | POA: Diagnosis not present

## 2020-01-20 DIAGNOSIS — N184 Chronic kidney disease, stage 4 (severe): Secondary | ICD-10-CM | POA: Diagnosis not present

## 2020-01-20 DIAGNOSIS — I10 Essential (primary) hypertension: Secondary | ICD-10-CM | POA: Diagnosis not present

## 2020-01-20 DIAGNOSIS — E1122 Type 2 diabetes mellitus with diabetic chronic kidney disease: Secondary | ICD-10-CM | POA: Diagnosis not present

## 2020-01-20 DIAGNOSIS — R7309 Other abnormal glucose: Secondary | ICD-10-CM | POA: Diagnosis not present

## 2020-02-17 DIAGNOSIS — H4713 Papilledema associated with retinal disorder: Secondary | ICD-10-CM | POA: Diagnosis not present

## 2020-02-18 DIAGNOSIS — H5340 Unspecified visual field defects: Secondary | ICD-10-CM | POA: Diagnosis not present

## 2020-02-18 DIAGNOSIS — M316 Other giant cell arteritis: Secondary | ICD-10-CM | POA: Diagnosis not present

## 2020-02-19 DIAGNOSIS — H539 Unspecified visual disturbance: Secondary | ICD-10-CM | POA: Diagnosis not present

## 2020-02-22 ENCOUNTER — Encounter (INDEPENDENT_AMBULATORY_CARE_PROVIDER_SITE_OTHER): Payer: PPO | Admitting: Ophthalmology

## 2020-02-22 ENCOUNTER — Other Ambulatory Visit: Payer: Self-pay

## 2020-02-22 DIAGNOSIS — I10 Essential (primary) hypertension: Secondary | ICD-10-CM

## 2020-02-22 DIAGNOSIS — H43813 Vitreous degeneration, bilateral: Secondary | ICD-10-CM

## 2020-02-22 DIAGNOSIS — H353132 Nonexudative age-related macular degeneration, bilateral, intermediate dry stage: Secondary | ICD-10-CM | POA: Diagnosis not present

## 2020-02-22 DIAGNOSIS — E113393 Type 2 diabetes mellitus with moderate nonproliferative diabetic retinopathy without macular edema, bilateral: Secondary | ICD-10-CM | POA: Diagnosis not present

## 2020-02-22 DIAGNOSIS — H4603 Optic papillitis, bilateral: Secondary | ICD-10-CM

## 2020-02-22 DIAGNOSIS — H35033 Hypertensive retinopathy, bilateral: Secondary | ICD-10-CM | POA: Diagnosis not present

## 2020-03-01 DIAGNOSIS — Z7984 Long term (current) use of oral hypoglycemic drugs: Secondary | ICD-10-CM | POA: Diagnosis not present

## 2020-03-01 DIAGNOSIS — E11319 Type 2 diabetes mellitus with unspecified diabetic retinopathy without macular edema: Secondary | ICD-10-CM | POA: Diagnosis not present

## 2020-03-01 DIAGNOSIS — H469 Unspecified optic neuritis: Secondary | ICD-10-CM | POA: Diagnosis not present

## 2020-03-01 DIAGNOSIS — E113293 Type 2 diabetes mellitus with mild nonproliferative diabetic retinopathy without macular edema, bilateral: Secondary | ICD-10-CM | POA: Diagnosis not present

## 2020-03-01 DIAGNOSIS — H4710 Unspecified papilledema: Secondary | ICD-10-CM | POA: Diagnosis not present

## 2020-03-01 DIAGNOSIS — H53412 Scotoma involving central area, left eye: Secondary | ICD-10-CM | POA: Diagnosis not present

## 2020-03-17 ENCOUNTER — Encounter (HOSPITAL_COMMUNITY): Payer: Self-pay | Admitting: Emergency Medicine

## 2020-03-17 ENCOUNTER — Inpatient Hospital Stay (HOSPITAL_COMMUNITY)
Admission: EM | Admit: 2020-03-17 | Discharge: 2020-03-20 | DRG: 378 | Disposition: A | Discharge status: Home / Self Care | Payer: PPO | Attending: Internal Medicine | Admitting: Internal Medicine

## 2020-03-17 ENCOUNTER — Other Ambulatory Visit: Payer: Self-pay

## 2020-03-17 DIAGNOSIS — Z7982 Long term (current) use of aspirin: Secondary | ICD-10-CM

## 2020-03-17 DIAGNOSIS — D62 Acute posthemorrhagic anemia: Secondary | ICD-10-CM | POA: Diagnosis not present

## 2020-03-17 DIAGNOSIS — Z90710 Acquired absence of both cervix and uterus: Secondary | ICD-10-CM

## 2020-03-17 DIAGNOSIS — K5791 Diverticulosis of intestine, part unspecified, without perforation or abscess with bleeding: Secondary | ICD-10-CM

## 2020-03-17 DIAGNOSIS — K5520 Angiodysplasia of colon without hemorrhage: Secondary | ICD-10-CM | POA: Diagnosis not present

## 2020-03-17 DIAGNOSIS — E119 Type 2 diabetes mellitus without complications: Secondary | ICD-10-CM | POA: Diagnosis not present

## 2020-03-17 DIAGNOSIS — N189 Chronic kidney disease, unspecified: Secondary | ICD-10-CM | POA: Diagnosis not present

## 2020-03-17 DIAGNOSIS — I129 Hypertensive chronic kidney disease with stage 1 through stage 4 chronic kidney disease, or unspecified chronic kidney disease: Secondary | ICD-10-CM | POA: Diagnosis present

## 2020-03-17 DIAGNOSIS — Z20822 Contact with and (suspected) exposure to covid-19: Secondary | ICD-10-CM | POA: Diagnosis not present

## 2020-03-17 DIAGNOSIS — E785 Hyperlipidemia, unspecified: Secondary | ICD-10-CM | POA: Diagnosis present

## 2020-03-17 DIAGNOSIS — K317 Polyp of stomach and duodenum: Secondary | ICD-10-CM | POA: Diagnosis present

## 2020-03-17 DIAGNOSIS — Z794 Long term (current) use of insulin: Secondary | ICD-10-CM | POA: Diagnosis not present

## 2020-03-17 DIAGNOSIS — N179 Acute kidney failure, unspecified: Secondary | ICD-10-CM

## 2020-03-17 DIAGNOSIS — K5731 Diverticulosis of large intestine without perforation or abscess with bleeding: Secondary | ICD-10-CM | POA: Diagnosis present

## 2020-03-17 DIAGNOSIS — R001 Bradycardia, unspecified: Secondary | ICD-10-CM | POA: Diagnosis not present

## 2020-03-17 DIAGNOSIS — N184 Chronic kidney disease, stage 4 (severe): Secondary | ICD-10-CM | POA: Diagnosis not present

## 2020-03-17 DIAGNOSIS — K648 Other hemorrhoids: Secondary | ICD-10-CM | POA: Diagnosis not present

## 2020-03-17 DIAGNOSIS — Z882 Allergy status to sulfonamides status: Secondary | ICD-10-CM

## 2020-03-17 DIAGNOSIS — E7800 Pure hypercholesterolemia, unspecified: Secondary | ICD-10-CM | POA: Diagnosis present

## 2020-03-17 DIAGNOSIS — E869 Volume depletion, unspecified: Secondary | ICD-10-CM | POA: Diagnosis present

## 2020-03-17 DIAGNOSIS — I10 Essential (primary) hypertension: Secondary | ICD-10-CM | POA: Diagnosis present

## 2020-03-17 DIAGNOSIS — E1169 Type 2 diabetes mellitus with other specified complication: Secondary | ICD-10-CM | POA: Diagnosis not present

## 2020-03-17 DIAGNOSIS — N1832 Chronic kidney disease, stage 3b: Secondary | ICD-10-CM

## 2020-03-17 DIAGNOSIS — K625 Hemorrhage of anus and rectum: Secondary | ICD-10-CM | POA: Diagnosis not present

## 2020-03-17 DIAGNOSIS — K219 Gastro-esophageal reflux disease without esophagitis: Secondary | ICD-10-CM | POA: Diagnosis not present

## 2020-03-17 DIAGNOSIS — N186 End stage renal disease: Secondary | ICD-10-CM | POA: Diagnosis not present

## 2020-03-17 DIAGNOSIS — Z885 Allergy status to narcotic agent status: Secondary | ICD-10-CM | POA: Diagnosis not present

## 2020-03-17 DIAGNOSIS — N183 Chronic kidney disease, stage 3 unspecified: Secondary | ICD-10-CM

## 2020-03-17 DIAGNOSIS — D649 Anemia, unspecified: Secondary | ICD-10-CM | POA: Diagnosis not present

## 2020-03-17 DIAGNOSIS — K449 Diaphragmatic hernia without obstruction or gangrene: Secondary | ICD-10-CM | POA: Diagnosis present

## 2020-03-17 DIAGNOSIS — F32A Depression, unspecified: Secondary | ICD-10-CM | POA: Diagnosis not present

## 2020-03-17 DIAGNOSIS — K922 Gastrointestinal hemorrhage, unspecified: Secondary | ICD-10-CM | POA: Diagnosis present

## 2020-03-17 DIAGNOSIS — K2970 Gastritis, unspecified, without bleeding: Secondary | ICD-10-CM | POA: Diagnosis not present

## 2020-03-17 DIAGNOSIS — E1122 Type 2 diabetes mellitus with diabetic chronic kidney disease: Secondary | ICD-10-CM | POA: Diagnosis not present

## 2020-03-17 DIAGNOSIS — K921 Melena: Principal | ICD-10-CM | POA: Diagnosis present

## 2020-03-17 DIAGNOSIS — Z9049 Acquired absence of other specified parts of digestive tract: Secondary | ICD-10-CM

## 2020-03-17 DIAGNOSIS — Z87440 Personal history of urinary (tract) infections: Secondary | ICD-10-CM

## 2020-03-17 DIAGNOSIS — K5521 Angiodysplasia of colon with hemorrhage: Secondary | ICD-10-CM | POA: Diagnosis not present

## 2020-03-17 DIAGNOSIS — Z79899 Other long term (current) drug therapy: Secondary | ICD-10-CM | POA: Diagnosis not present

## 2020-03-17 DIAGNOSIS — Z7984 Long term (current) use of oral hypoglycemic drugs: Secondary | ICD-10-CM

## 2020-03-17 LAB — CBC WITH DIFFERENTIAL/PLATELET
Abs Immature Granulocytes: 0.02 10*3/uL (ref 0.00–0.07)
Basophils Absolute: 0 10*3/uL (ref 0.0–0.1)
Basophils Relative: 1 %
Eosinophils Absolute: 0.2 10*3/uL (ref 0.0–0.5)
Eosinophils Relative: 3 %
HCT: 25 % — ABNORMAL LOW (ref 36.0–46.0)
Hemoglobin: 8 g/dL — ABNORMAL LOW (ref 12.0–15.0)
Immature Granulocytes: 0 %
Lymphocytes Relative: 19 %
Lymphs Abs: 1.2 10*3/uL (ref 0.7–4.0)
MCH: 30.3 pg (ref 26.0–34.0)
MCHC: 32 g/dL (ref 30.0–36.0)
MCV: 94.7 fL (ref 80.0–100.0)
Monocytes Absolute: 0.4 10*3/uL (ref 0.1–1.0)
Monocytes Relative: 6 %
Neutro Abs: 4.7 10*3/uL (ref 1.7–7.7)
Neutrophils Relative %: 71 %
Platelets: 176 10*3/uL (ref 150–400)
RBC: 2.64 MIL/uL — ABNORMAL LOW (ref 3.87–5.11)
RDW: 13.3 % (ref 11.5–15.5)
WBC: 6.6 10*3/uL (ref 4.0–10.5)
nRBC: 0 % (ref 0.0–0.2)

## 2020-03-17 LAB — APTT: aPTT: 27 seconds (ref 24–36)

## 2020-03-17 LAB — HEMOGLOBIN A1C
Hgb A1c MFr Bld: 7.1 % — ABNORMAL HIGH (ref 4.8–5.6)
Mean Plasma Glucose: 157.07 mg/dL

## 2020-03-17 LAB — IRON AND TIBC
Iron: 50 ug/dL (ref 28–170)
Saturation Ratios: 13 % (ref 10.4–31.8)
TIBC: 376 ug/dL (ref 250–450)
UIBC: 326 ug/dL

## 2020-03-17 LAB — COMPREHENSIVE METABOLIC PANEL
ALT: 13 U/L (ref 0–44)
AST: 15 U/L (ref 15–41)
Albumin: 3.6 g/dL (ref 3.5–5.0)
Alkaline Phosphatase: 58 U/L (ref 38–126)
Anion gap: 8 (ref 5–15)
BUN: 51 mg/dL — ABNORMAL HIGH (ref 8–23)
CO2: 21 mmol/L — ABNORMAL LOW (ref 22–32)
Calcium: 8.8 mg/dL — ABNORMAL LOW (ref 8.9–10.3)
Chloride: 108 mmol/L (ref 98–111)
Creatinine, Ser: 2.34 mg/dL — ABNORMAL HIGH (ref 0.44–1.00)
GFR, Estimated: 20 mL/min — ABNORMAL LOW (ref >60)
Glucose, Bld: 164 mg/dL — ABNORMAL HIGH (ref 70–99)
Potassium: 4.2 mmol/L (ref 3.5–5.1)
Sodium: 137 mmol/L (ref 135–145)
Total Bilirubin: 0.4 mg/dL (ref 0.3–1.2)
Total Protein: 6.4 g/dL — ABNORMAL LOW (ref 6.5–8.1)

## 2020-03-17 LAB — RETICULOCYTES
Immature Retic Fract: 17.4 % — ABNORMAL HIGH (ref 2.3–15.9)
RBC.: 2.62 MIL/uL — ABNORMAL LOW (ref 3.87–5.11)
Retic Count, Absolute: 63.9 10*3/uL (ref 19.0–186.0)
Retic Ct Pct: 2.4 % (ref 0.4–3.1)

## 2020-03-17 LAB — CBC
HCT: 27.7 % — ABNORMAL LOW (ref 36.0–46.0)
Hemoglobin: 8.5 g/dL — ABNORMAL LOW (ref 12.0–15.0)
MCH: 30.4 pg (ref 26.0–34.0)
MCHC: 30.7 g/dL (ref 30.0–36.0)
MCV: 98.9 fL (ref 80.0–100.0)
Platelets: 166 10*3/uL (ref 150–400)
RBC: 2.8 MIL/uL — ABNORMAL LOW (ref 3.87–5.11)
RDW: 13.2 % (ref 11.5–15.5)
WBC: 7.8 10*3/uL (ref 4.0–10.5)
nRBC: 0 % (ref 0.0–0.2)

## 2020-03-17 LAB — TYPE AND SCREEN

## 2020-03-17 LAB — VITAMIN B12: Vitamin B-12: 478 pg/mL (ref 180–914)

## 2020-03-17 LAB — GLUCOSE, CAPILLARY: Glucose-Capillary: 144 mg/dL — ABNORMAL HIGH (ref 70–99)

## 2020-03-17 LAB — FERRITIN: Ferritin: 20 ng/mL (ref 11–307)

## 2020-03-17 LAB — PROTIME-INR
INR: 1.1 (ref 0.8–1.2)
Prothrombin Time: 14 seconds (ref 11.4–15.2)

## 2020-03-17 LAB — FOLATE: Folate: 40.4 ng/mL (ref >5.9)

## 2020-03-17 MED ORDER — ACETAMINOPHEN 325 MG PO TABS
650.0000 mg | ORAL_TABLET | Freq: Four times a day (QID) | ORAL | Status: DC | PRN
  Administered 2020-03-17: 650 mg via ORAL
  Filled 2020-03-17: qty 2

## 2020-03-17 MED ORDER — AMLODIPINE BESYLATE 5 MG PO TABS
5.0000 mg | ORAL_TABLET | Freq: Every day | ORAL | Status: DC
Start: 2020-03-18 — End: 2020-03-17

## 2020-03-17 MED ORDER — ACETAMINOPHEN 650 MG RE SUPP: 650.0000 mg | Freq: Four times a day (QID) | RECTAL | Status: DC | PRN

## 2020-03-17 MED ORDER — SODIUM CHLORIDE 0.9 % IV BOLUS
500.0000 mL | Freq: Once | INTRAVENOUS | Status: AC
  Administered 2020-03-17: 500 mL via INTRAVENOUS

## 2020-03-17 MED ORDER — FAMOTIDINE IN NACL 20-0.9 MG/50ML-% IV SOLN
20.0000 mg | INTRAVENOUS | Status: AC
  Administered 2020-03-17: 20 mg via INTRAVENOUS
  Filled 2020-03-17: qty 50

## 2020-03-17 MED ORDER — SODIUM CHLORIDE 0.9 % IV SOLN: INTRAVENOUS | Status: AC

## 2020-03-17 MED ORDER — AMLODIPINE BESYLATE 5 MG PO TABS
10.0000 mg | ORAL_TABLET | Freq: Every day | ORAL | Status: DC
Start: 2020-03-18 — End: 2020-03-20
  Administered 2020-03-18 – 2020-03-20 (×3): 10 mg via ORAL
  Filled 2020-03-17 (×3): qty 2

## 2020-03-17 MED ORDER — ATENOLOL 25 MG PO TABS
25.0000 mg | ORAL_TABLET | Freq: Every day | ORAL | Status: DC
Start: 2020-03-17 — End: 2020-03-17

## 2020-03-17 MED ORDER — AMLODIPINE BESYLATE 5 MG PO TABS: 5.0000 mg | ORAL_TABLET | Freq: Every day | ORAL | Status: DC

## 2020-03-17 MED ORDER — PANTOPRAZOLE SODIUM 40 MG IV SOLR
40.0000 mg | INTRAVENOUS | Status: DC
  Administered 2020-03-18 – 2020-03-19 (×2): 40 mg via INTRAVENOUS
  Filled 2020-03-17 (×2): qty 40

## 2020-03-17 MED ORDER — PANTOPRAZOLE SODIUM 40 MG IV SOLR
40.0000 mg | INTRAVENOUS | Status: AC
  Administered 2020-03-17: 40 mg via INTRAVENOUS
  Filled 2020-03-17: qty 40

## 2020-03-17 MED ORDER — BRIMONIDINE TARTRATE 0.15 % OP SOLN
1.0000 [drp] | Freq: Two times a day (BID) | OPHTHALMIC | Status: DC
  Administered 2020-03-18 – 2020-03-20 (×5): 1 [drp] via OPHTHALMIC
  Filled 2020-03-17: qty 5

## 2020-03-17 MED ORDER — SERTRALINE HCL 50 MG PO TABS
100.0000 mg | ORAL_TABLET | Freq: Every day | ORAL | Status: DC
  Administered 2020-03-18 – 2020-03-20 (×3): 100 mg via ORAL
  Filled 2020-03-17 (×3): qty 2

## 2020-03-17 MED ORDER — INSULIN ASPART 100 UNIT/ML ~~LOC~~ SOLN
0.0000 [IU] | Freq: Four times a day (QID) | SUBCUTANEOUS | Status: DC
  Administered 2020-03-17: 1 [IU] via SUBCUTANEOUS
  Administered 2020-03-18: 3 [IU] via SUBCUTANEOUS
  Administered 2020-03-19: 1 [IU] via SUBCUTANEOUS

## 2020-03-17 MED ORDER — INSULIN ASPART 100 UNIT/ML ~~LOC~~ SOLN: 0.0000 [IU] | Freq: Three times a day (TID) | SUBCUTANEOUS | Status: DC

## 2020-03-17 MED ORDER — AMLODIPINE BESYLATE 5 MG PO TABS
5.0000 mg | ORAL_TABLET | ORAL | Status: AC
  Administered 2020-03-17: 5 mg via ORAL
  Filled 2020-03-17: qty 1

## 2020-03-17 MED ORDER — ATORVASTATIN CALCIUM 20 MG PO TABS
20.0000 mg | ORAL_TABLET | Freq: Every evening | ORAL | Status: DC
  Administered 2020-03-17 – 2020-03-19 (×3): 20 mg via ORAL
  Filled 2020-03-17: qty 1
  Filled 2020-03-17: qty 2
  Filled 2020-03-17: qty 1

## 2020-03-17 MED ORDER — ONDANSETRON HCL 4 MG/2ML IJ SOLN: 4.0000 mg | Freq: Four times a day (QID) | INTRAMUSCULAR | Status: DC | PRN

## 2020-03-17 MED ORDER — ONDANSETRON HCL 4 MG PO TABS: 4.0000 mg | ORAL_TABLET | Freq: Four times a day (QID) | ORAL | Status: DC | PRN

## 2020-03-17 NOTE — ED Notes (Signed)
hospitalist notified of BP (188/47) & HR (46). Said to hold atenolol.

## 2020-03-17 NOTE — ED Triage Notes (Signed)
Pt was referred to the ED by Dr. Leta Baptist who called ahead to say she was positive for a significant amount of blood in her stools.  Pt states she first noticed it on Tuesday of this week.

## 2020-03-17 NOTE — H&P (Addendum)
History and Physical    Tammy Dyer:[id number] DOB: 1933-06-03 DOA: 03/17/2020  PCP: Asencion Noble, MD   Patient coming from: Home  I have personally briefly reviewed patient's old medical records in [place]  Chief Complaint: Blood in stools   HPI: Tammy Dyer is a 87 y.o. female with medical history significant for HTN, DM.  Patient presented to the ED with complaints of blood in her stools.  Daughter is present at bedside and assist with the history.  Patient has been having blood in her stools over the past week, but 2 days ago patient had significant amount of blood in her stool, started patient was dizzy while having a bowel movement.  No vomiting, no abdominal pain, no pain with bowel movements.  Patient denies dizziness since then, no chest pain.  She reports chronic and unchanged difficulty breathing with activity. They are unsure when patient had her last colonoscopy., but patient has a history of diverticulosis and diverticulitis, but she has never had a bleeding nearly this severe in the past.  Daughter reports patient went to have had very small amounts of bleeding occasionally. Patient saw her primary care provider today, rectal exam revealed blood in the rectal vault, she was subsequently referred to the ED.  ED Course: Heart rate 40s.  Blood pressure systolic 638V to 564P.  Hemoglobin 8.  Creatinine elevated 2.34.  BUN 51. EDP consulted cardiology, will see patient.  Review of Systems: As per HPI all other systems reviewed and negative.  Past Medical History:  Diagnosis Date  . CRI (chronic renal insufficiency)   . DM (diabetes mellitus) ([place])   . GERD (gastroesophageal reflux disease)   . HTN (hypertension)   . Hypercholesterolemia     Past Surgical History:  Procedure Laterality Date  . ABDOMINAL HYSTERECTOMY    . CATARACT EXTRACTION    . CHOLECYSTECTOMY    . COLONOSCOPY  2005   Rehman: diverticulosis, hemorrhoids, screening 2015  .  ESOPHAGOGASTRODUODENOSCOPY  10/22/06   normal esophagus/small hiatal hernia     reports that she has never smoked. She has never used smokeless tobacco. She reports that she does not drink alcohol and does not use drugs.  Allergies  Allergen Reactions  . Codeine     REACTION: head ache  . Sulfonamide Derivatives     REACTION: yeast infection    Family History  Problem Relation Age of Onset  . Colon cancer Neg Hx     Prior to Admission medications   Medication Sig Start Date End Date Taking? Authorizing Provider  acetaminophen (TYLENOL) 500 MG tablet Take 500 mg by mouth every 6 (six) hours as needed for mild pain or moderate pain.    [provider]  amLODipine (NORVASC) 5 MG tablet Take 5 mg by mouth daily.    [provider]  aspirin 81 MG tablet Take 81 mg by mouth daily.    [provider]  atenolol (TENORMIN) 25 MG tablet Take 25 mg by mouth daily.    [provider]  atorvastatin (LIPITOR) 20 MG tablet Take 20 mg by mouth every evening.  03/24/11   [provider]  benzonatate (TESSALON) 200 MG capsule Take 200 mg by mouth 3 (three) times daily as needed for cough.    [provider]  chlorthalidone (HYGROTON) 25 MG tablet Take 25 mg by mouth daily.    [provider]  glimepiride (AMARYL) 2 MG tablet Take 2 mg by mouth daily before breakfast.  03/12/11   [provider]  Liniments (BLUE-EMU SUPER STRENGTH) CREA Apply 1 application topically daily as needed (to arm(s) for pain).    [provider]  Melatonin 10 MG TABS Take 10 mg by mouth at bedtime.    [provider]  omeprazole (PRILOSEC) 20 MG capsule Take 1 capsule (20 mg total) by mouth daily. 04/19/15   Rourk, Cristopher Estimable, MD  pioglitazone (ACTOS) 30 MG tablet Take 30 mg by mouth daily.  03/04/14   [provider]  sertraline (ZOLOFT) 100 MG tablet Take 100 mg by mouth daily.  03/31/11   [provider]  trimethoprim  (TRIMPEX) 100 MG tablet Take 100 mg by mouth daily.  03/27/14   [provider]    Physical Exam: Vitals:   03/17/20 1449 03/17/20 1526 03/17/20 1530 03/17/20 1600  BP: (!) 189/44 (!) 176/51 (!) 169/46 (!) 172/49  Pulse: (!) 44 (!) 43 (!) 45 (!) 44  Resp: 20     Temp: 97.6 F (36.4 C)     TempSrc: Oral     SpO2: 100% 99% 98% 99%  Weight:      Height:        Constitutional: NAD, calm, comfortable Vitals:   03/17/20 1449 03/17/20 1526 03/17/20 1530 03/17/20 1600  BP: (!) 189/44 (!) 176/51 (!) 169/46 (!) 172/49  Pulse: (!) 44 (!) 43 (!) 45 (!) 44  Resp: 20     Temp: 97.6 F (36.4 C)     TempSrc: Oral     SpO2: 100% 99% 98% 99%  Weight:      Height:       Eyes: lids and conjunctivae normal ENMT: Mucous membranes are moist.  Neck: normal, supple, no masses, no thyromegaly Respiratory: clear to auscultation bilaterally, no wheezing, no crackles. Normal respiratory effort. No accessory muscle use.  Cardiovascular: Regular rate and rhythm, no murmurs / rubs / gallops. No extremity edema. 2+ pedal pulses.   Abdomen: no tenderness, no masses palpated. No hepatosplenomegaly. Musculoskeletal: no clubbing / cyanosis. No joint deformity upper and lower extremities. Good ROM, no contractures. Normal muscle tone.  Skin: no rashes, lesions, ulcers. No induration Neurologic: No apparent cranial abnormality, moving extremities spontaneously. Psychiatric: Normal judgment and insight. Alert and oriented x 3. Normal mood.   Labs on Admission: I have personally reviewed following labs and imaging studies  CBC: Recent Labs  Lab 03/17/20 1528  WBC 6.6  NEUTROABS 4.7  HGB 8.0*  HCT 25.0*  MCV 94.7  PLT 545   Basic Metabolic Panel: Recent Labs  Lab 03/17/20 1528  NA 137  K 4.2  CL 108  CO2 21*  GLUCOSE 164*  BUN 51*  CREATININE 2.34*  CALCIUM 8.8*   GFR: Estimated Creatinine Clearance: 15.4 mL/min (A) (by C-G formula based on SCr of 2.34 mg/dL (H)). Liver Function  Tests: Recent Labs  Lab 03/17/20 1528  AST 15  ALT 13  ALKPHOS 58  BILITOT 0.4  PROT 6.4*  ALBUMIN 3.6   Coagulation Profile: Recent Labs  Lab 03/17/20 1528  INR 1.1   Anemia Panel: Recent Labs    03/17/20 1529  RETICCTPCT 2.4   Urine analysis:    Component Value Date/Time   COLORURINE AMBER (A) 02/07/2016 1600   APPEARANCEUR CLEAR 02/07/2016 1600   LABSPEC 1.011 02/07/2016 1600   PHURINE 5.0 02/07/2016 1600   GLUCOSEU NEGATIVE 02/07/2016 1600   HGBUR MODERATE (A) 02/07/2016 1600   BILIRUBINUR NEGATIVE 02/07/2016 1600   KETONESUR NEGATIVE 02/07/2016 1600  PROTEINUR 30 (A) 02/07/2016 1600   NITRITE POSITIVE (A) 02/07/2016 1600   LEUKOCYTESUR SMALL (A) 02/07/2016 1600    Radiological Exams on Admission: No results found.  EKG: None.  Assessment/Plan Principal Problem:   Acute GI bleeding Active Problems:   Diabetes ([place])   Essential hypertension   Acute GI bleed-bloody stools, likely lower GI bleed.  You have diverticulosis.  Prior endoscopy/colonoscopy in chart.  Hemoglobin 8, last check per care everywhere 2/8 hemoglobin was 10.9. -Clear liquid diet - N.p.o. midnight - EDP consulted GI Dr. Abbey Chatters, will see in a.m. -Continue IV Protonix 40 daily -IV fluids - Goal hemoglobin greater than 8 -Monitor CBC closely  AKI on CKD 3B- creatinine 2.34.  Baseline 1.7-1.8. -500 mill bolus given, continue N/s 75cc/hr x 20hrs - BMP a.m - Hold chlorthalidone   Bradycardia- Hr 40s, per chart 2018- heart rate 60s - 70s.  - Hold atenolol.  HTN-pressure elevated systolic 034 to 917. -Resume Norvasc at increased dose 10mg ,  - Hold atenolol with bradycardia -Hold chlorthalidone for now for now in the setting of GI loss and AKI.  DM-random glucose 164. - HgbA1c -Hold Lantus, Actos, glimepiride - SSI- S Q6h   DVT prophylaxis: SCDS Code Status: Full code, confirmed with daughter at bedside Family Communication: Daughter at bedside Disposition Plan:  ~ 2  days Consults called: GI Admission status: Obs, tele   Bethena Roys MD [facility]  03/17/2020, 6:17 PM

## 2020-03-17 NOTE — ED Provider Notes (Signed)
[facility] EMERGENCY DEPARTMENT Provider Note   CSN: [id number] Arrival date & time: 03/17/20  1441     History Chief Complaint  Patient presents with  . Blood In [place] is a 87 y.o. female.  HPI   This patient is a very pleasant 87 year old female, she has a history of prior diverticulosis based on a colonoscopy in the past, I do not have access to these records but according to the daughter who is an additional historian and according to the patient they both recall that she had diverticulosis and has had one episode of diverticulitis in the past.  She has never had gastrointestinal bleeding but reports that over the last several weeks she has had increased amounts of blood and over the last 2 days it has been significant amounts of bright red blood.  The daughter went into the bathroom and found her to have a significant amount of rust colored stool, when she went to her family doctor's office today, Dr. Willey Blade, the rectal exam revealed a significant amount of blood in the rectal vault and she was referred to the emergency department immediately.  The patient does report that she has been feeling very lightheaded like she is going to pass out frequently.  She is little more short of breath than usual and feels pale.  No significant abdominal pain, no back pain, no coughing, no fever, no chills, no dysuria, no swelling, no rashes.  She does take a baby aspirin but no other anticoagulants.  Past Medical History:  Diagnosis Date  . CRI (chronic renal insufficiency)   . DM (diabetes mellitus) ([place])   . GERD (gastroesophageal reflux disease)   . HTN (hypertension)   . Hypercholesterolemia     Patient Active Problem List   Diagnosis Date Noted  . SBO (small bowel obstruction) ([place]) 02/07/2016  . Small bowel obstruction ([place]) 02/07/2016  . Elevated troponin 02/07/2016  . GERD 03/15/2010  . POSTHERPETIC NEURALGIA 10/06/2007  . DIABETES MELLITUS 10/06/2007  .  HYPERLIPIDEMIA 10/06/2007  . Essential hypertension 10/06/2007  . EXTERNAL HEMORRHOIDS 10/06/2007  . HIATAL HERNIA 10/06/2007  . DIVERTICULOSIS, COLON 10/06/2007  . CHOLECYSTITIS 10/06/2007  . Disorder of kidney and ureter 10/06/2007  . UTI'S, RECURRENT 10/06/2007  . ANOREXIA 10/06/2007  . WEIGHT LOSS 10/06/2007  . NAUSEA, CHRONIC 10/06/2007  . EARLY SATIETY 10/06/2007    Past Surgical History:  Procedure Laterality Date  . ABDOMINAL HYSTERECTOMY    . CATARACT EXTRACTION    . CHOLECYSTECTOMY    . COLONOSCOPY  2005   Rehman: diverticulosis, hemorrhoids, screening 2015  . ESOPHAGOGASTRODUODENOSCOPY  10/22/06   normal esophagus/small hiatal hernia     OB History   No obstetric history on file.     Family History  Problem Relation Age of Onset  . Colon cancer Neg Hx     Social History   Tobacco Use  . Smoking status: Never Smoker  . Smokeless tobacco: Never Used  Vaping Use  . Vaping Use: Never used  Substance Use Topics  . Alcohol use: No  . Drug use: No    Home Medications Prior to Admission medications   Medication Sig Start Date End Date Taking? Authorizing Provider  acetaminophen (TYLENOL) 500 MG tablet Take 500 mg by mouth every 6 (six) hours as needed for mild pain or moderate pain.    [provider]  amLODipine (NORVASC) 5 MG tablet Take 5 mg by mouth daily.    [provider]  aspirin 81 MG tablet Take 81 mg by mouth daily.    [provider]  atenolol (TENORMIN) 25 MG tablet Take 25 mg by mouth daily.    [provider]  atorvastatin (LIPITOR) 20 MG tablet Take 20 mg by mouth every evening.  03/24/11   [provider]  benzonatate (TESSALON) 200 MG capsule Take 200 mg by mouth 3 (three) times daily as needed for cough.    [provider]  chlorthalidone (HYGROTON) 25 MG tablet Take 25 mg by mouth daily.    [provider]  glimepiride (AMARYL) 2 MG tablet Take 2 mg by mouth daily before  breakfast.  03/12/11   [provider]  Liniments (BLUE-EMU SUPER STRENGTH) CREA Apply 1 application topically daily as needed (to arm(s) for pain).    [provider]  Melatonin 10 MG TABS Take 10 mg by mouth at bedtime.    [provider]  omeprazole (PRILOSEC) 20 MG capsule Take 1 capsule (20 mg total) by mouth daily. 04/19/15   Rourk, Cristopher Estimable, MD  pioglitazone (ACTOS) 30 MG tablet Take 30 mg by mouth daily.  03/04/14   [provider]  sertraline (ZOLOFT) 100 MG tablet Take 100 mg by mouth daily.  03/31/11   [provider]  trimethoprim (TRIMPEX) 100 MG tablet Take 100 mg by mouth daily.  03/27/14   [provider]    Allergies    Codeine and Sulfonamide derivatives  Review of Systems   Review of Systems  All other systems reviewed and are negative.   Physical Exam Updated Vital Signs BP (!) 172/49   Pulse (!) 44   Temp 97.6 F (36.4 C) (Oral)   Resp 20   Ht 1.549 m (5\' 1" )   Wt 64.4 kg   SpO2 99%   BMI 26.83 kg/m   Physical Exam Vitals and nursing note reviewed.  Constitutional:      General: She is not in acute distress.    Appearance: She is well-developed and well-nourished.  HENT:     Head: Normocephalic and atraumatic.     Mouth/Throat:     Mouth: Oropharynx is clear and moist.     Pharynx: No oropharyngeal exudate.     Comments: Pale mucous membranes Eyes:     General: No scleral icterus.       Right eye: No discharge.        Left eye: No discharge.     Extraocular Movements: EOM normal.     Pupils: Pupils are equal, round, and reactive to light.     Comments: Pale conjunctive a  Neck:     Thyroid: No thyromegaly.     Vascular: No JVD.  Cardiovascular:     Rate and Rhythm: Regular rhythm. Bradycardia present.     Pulses: Intact distal pulses.     Heart sounds: Normal heart sounds. No murmur heard. No friction rub. No gallop.      Comments: Strong pulses at the radial arteries, no JVD, pulse of 44  bradycardic Pulmonary:     Effort: Pulmonary effort is normal. No respiratory distress.     Breath sounds: Normal breath sounds. No wheezing or rales.  Abdominal:     General: Bowel sounds are normal. There is no distension.     Palpations: Abdomen is soft. There is no mass.     Tenderness: There is no abdominal tenderness.     Comments: Nontender abdomen, normal bowel sounds  Musculoskeletal:  General: No tenderness or edema. Normal range of motion.     Cervical back: Normal range of motion and neck supple.  Lymphadenopathy:     Cervical: No cervical adenopathy.  Skin:    General: Skin is warm and dry.     Coloration: Skin is pale.     Findings: No erythema or rash.  Neurological:     General: No focal deficit present.     Mental Status: She is alert.     Coordination: Coordination normal.     Comments: The patient is able to get into the bed from the chair but appears generally weak.  She answers questions without difficulty, she is slow to answer, relies on her daughter answer most questions, she has equal strength in bilateral upper extremities, she is able to ambulate but slowly and slightly unsteadily, she has no facial droop, clear speech  Psychiatric:        Mood and Affect: Mood and affect normal.        Behavior: Behavior normal.     ED Results / Procedures / Treatments   Labs (all labs ordered are listed, but only abnormal results are displayed) Labs Reviewed  CBC WITH DIFFERENTIAL/PLATELET - Abnormal; Notable for the following components:      Result Value   RBC 2.64 (*)    Hemoglobin 8.0 (*)    HCT 25.0 (*)    All other components within normal limits  COMPREHENSIVE METABOLIC PANEL - Abnormal; Notable for the following components:   CO2 21 (*)    Glucose, Bld 164 (*)    BUN 51 (*)    Creatinine, Ser 2.34 (*)    Calcium 8.8 (*)    Total Protein 6.4 (*)    GFR, Estimated 20 (*)    All other components within normal limits  RETICULOCYTES - Abnormal;  Notable for the following components:   RBC. 2.62 (*)    Immature Retic Fract 17.4 (*)    All other components within normal limits  SARS CORONAVIRUS 2 (TAT 6-24 HRS)  APTT  PROTIME-INR  VITAMIN B12  FOLATE  IRON AND TIBC  FERRITIN  TYPE AND SCREEN    EKG None  Radiology No results found.  Procedures Procedures   Medications Ordered in ED Medications  famotidine (PEPCID) IVPB 20 mg premix (20 mg Intravenous New Bag/Given 03/17/20 1602)  sodium chloride 0.9 % bolus 500 mL (500 mLs Intravenous New Bag/Given 03/17/20 1603)  pantoprazole (PROTONIX) injection 40 mg (40 mg Intravenous Given 03/17/20 1602)    ED Course  I have reviewed the triage vital signs and the nursing notes.  Pertinent labs & imaging results that were available during my care of the patient were reviewed by me and considered in my medical decision making (see chart for details).    MDM Rules/Calculators/A&P                          Rectal exam was done in the office, deferred,  CBC pending, I suspect that she has dropped from her hemoglobin of 10.6 which was drawn on February 8 based on care everywhere which I have explored as far as past medical records.  The patient will need Pepcid, Protonix, type and screen, she may need a transfusion if she is that low, thankfully she is not tachycardic nor is she hypotensive but just the opposite.  Will discuss with gastroenterology and hospitalist.  Hgb is 8, Cr is up to 2.3, both  changed Discussed with GI - Dr. Abbey Chatters -  Will admit to hospitalist HD stable at this time - no need for emergent transfusion Covid test pending - screening.  D/w Dr. Denton Brick who will admit  Final Clinical Impression(s) / ED Diagnoses Final diagnoses:  Gastrointestinal hemorrhage associated with intestinal diverticulosis  Stage 4 chronic kidney disease ([place])  Hypertension, unspecified type      Noemi Chapel, MD 03/17/20 [phone]

## 2020-03-18 DIAGNOSIS — K449 Diaphragmatic hernia without obstruction or gangrene: Secondary | ICD-10-CM | POA: Diagnosis present

## 2020-03-18 DIAGNOSIS — E1122 Type 2 diabetes mellitus with diabetic chronic kidney disease: Secondary | ICD-10-CM | POA: Diagnosis present

## 2020-03-18 DIAGNOSIS — E785 Hyperlipidemia, unspecified: Secondary | ICD-10-CM | POA: Diagnosis present

## 2020-03-18 DIAGNOSIS — N183 Chronic kidney disease, stage 3 unspecified: Secondary | ICD-10-CM

## 2020-03-18 DIAGNOSIS — D62 Acute posthemorrhagic anemia: Secondary | ICD-10-CM | POA: Diagnosis present

## 2020-03-18 DIAGNOSIS — R001 Bradycardia, unspecified: Secondary | ICD-10-CM | POA: Diagnosis present

## 2020-03-18 DIAGNOSIS — I10 Essential (primary) hypertension: Secondary | ICD-10-CM | POA: Diagnosis not present

## 2020-03-18 DIAGNOSIS — Z79899 Other long term (current) drug therapy: Secondary | ICD-10-CM | POA: Diagnosis not present

## 2020-03-18 DIAGNOSIS — Z90710 Acquired absence of both cervix and uterus: Secondary | ICD-10-CM | POA: Diagnosis not present

## 2020-03-18 DIAGNOSIS — N1832 Chronic kidney disease, stage 3b: Secondary | ICD-10-CM

## 2020-03-18 DIAGNOSIS — K648 Other hemorrhoids: Secondary | ICD-10-CM | POA: Diagnosis present

## 2020-03-18 DIAGNOSIS — Z885 Allergy status to narcotic agent status: Secondary | ICD-10-CM | POA: Diagnosis not present

## 2020-03-18 DIAGNOSIS — K5791 Diverticulosis of intestine, part unspecified, without perforation or abscess with bleeding: Secondary | ICD-10-CM

## 2020-03-18 DIAGNOSIS — E7800 Pure hypercholesterolemia, unspecified: Secondary | ICD-10-CM | POA: Diagnosis present

## 2020-03-18 DIAGNOSIS — K922 Gastrointestinal hemorrhage, unspecified: Secondary | ICD-10-CM | POA: Diagnosis present

## 2020-03-18 DIAGNOSIS — Z87440 Personal history of urinary (tract) infections: Secondary | ICD-10-CM | POA: Diagnosis not present

## 2020-03-18 DIAGNOSIS — K5521 Angiodysplasia of colon with hemorrhage: Secondary | ICD-10-CM | POA: Diagnosis not present

## 2020-03-18 DIAGNOSIS — K5731 Diverticulosis of large intestine without perforation or abscess with bleeding: Secondary | ICD-10-CM | POA: Diagnosis present

## 2020-03-18 DIAGNOSIS — F32A Depression, unspecified: Secondary | ICD-10-CM | POA: Diagnosis present

## 2020-03-18 DIAGNOSIS — N184 Chronic kidney disease, stage 4 (severe): Secondary | ICD-10-CM | POA: Diagnosis not present

## 2020-03-18 DIAGNOSIS — K921 Melena: Secondary | ICD-10-CM | POA: Diagnosis not present

## 2020-03-18 DIAGNOSIS — E869 Volume depletion, unspecified: Secondary | ICD-10-CM | POA: Diagnosis present

## 2020-03-18 DIAGNOSIS — K2970 Gastritis, unspecified, without bleeding: Secondary | ICD-10-CM | POA: Diagnosis not present

## 2020-03-18 DIAGNOSIS — Z7982 Long term (current) use of aspirin: Secondary | ICD-10-CM | POA: Diagnosis not present

## 2020-03-18 DIAGNOSIS — N179 Acute kidney failure, unspecified: Secondary | ICD-10-CM | POA: Diagnosis present

## 2020-03-18 DIAGNOSIS — Z20822 Contact with and (suspected) exposure to covid-19: Secondary | ICD-10-CM | POA: Diagnosis present

## 2020-03-18 DIAGNOSIS — K219 Gastro-esophageal reflux disease without esophagitis: Secondary | ICD-10-CM | POA: Diagnosis present

## 2020-03-18 DIAGNOSIS — I129 Hypertensive chronic kidney disease with stage 1 through stage 4 chronic kidney disease, or unspecified chronic kidney disease: Secondary | ICD-10-CM | POA: Diagnosis present

## 2020-03-18 DIAGNOSIS — E119 Type 2 diabetes mellitus without complications: Secondary | ICD-10-CM

## 2020-03-18 DIAGNOSIS — K317 Polyp of stomach and duodenum: Secondary | ICD-10-CM | POA: Diagnosis not present

## 2020-03-18 DIAGNOSIS — K5520 Angiodysplasia of colon without hemorrhage: Secondary | ICD-10-CM | POA: Diagnosis present

## 2020-03-18 LAB — CBC
HCT: 20.2 % — ABNORMAL LOW (ref 36.0–46.0)
HCT: 33 % — ABNORMAL LOW (ref 36.0–46.0)
HCT: 31.2 % — ABNORMAL LOW (ref 36.0–46.0)
Hemoglobin: 6.5 g/dL — CL (ref 12.0–15.0)
Hemoglobin: 10.5 g/dL — ABNORMAL LOW (ref 12.0–15.0)
Hemoglobin: 10 g/dL — ABNORMAL LOW (ref 12.0–15.0)
MCH: 30.4 pg (ref 26.0–34.0)
MCH: 29.9 pg (ref 26.0–34.0)
MCH: 30 pg (ref 26.0–34.0)
MCHC: 32.2 g/dL (ref 30.0–36.0)
MCHC: 31.8 g/dL (ref 30.0–36.0)
MCHC: 32.1 g/dL (ref 30.0–36.0)
MCV: 94.4 fL (ref 80.0–100.0)
MCV: 94 fL (ref 80.0–100.0)
MCV: 93.7 fL (ref 80.0–100.0)
Platelets: 150 10*3/uL (ref 150–400)
Platelets: 148 10*3/uL — ABNORMAL LOW (ref 150–400)
Platelets: 180 10*3/uL (ref 150–400)
RBC: 2.14 MIL/uL — ABNORMAL LOW (ref 3.87–5.11)
RBC: 3.51 MIL/uL — ABNORMAL LOW (ref 3.87–5.11)
RBC: 3.33 MIL/uL — ABNORMAL LOW (ref 3.87–5.11)
RDW: 13.3 % (ref 11.5–15.5)
RDW: 13.8 % (ref 11.5–15.5)
RDW: 14 % (ref 11.5–15.5)
WBC: 7 10*3/uL (ref 4.0–10.5)
WBC: 7.1 10*3/uL (ref 4.0–10.5)
WBC: 9.5 10*3/uL (ref 4.0–10.5)
nRBC: 0 % (ref 0.0–0.2)
nRBC: 0 % (ref 0.0–0.2)
nRBC: 0 % (ref 0.0–0.2)

## 2020-03-18 LAB — BASIC METABOLIC PANEL
Anion gap: 6 (ref 5–15)
BUN: 42 mg/dL — ABNORMAL HIGH (ref 8–23)
CO2: 20 mmol/L — ABNORMAL LOW (ref 22–32)
Calcium: 8.1 mg/dL — ABNORMAL LOW (ref 8.9–10.3)
Chloride: 112 mmol/L — ABNORMAL HIGH (ref 98–111)
Creatinine, Ser: 2.03 mg/dL — ABNORMAL HIGH (ref 0.44–1.00)
GFR, Estimated: 24 mL/min — ABNORMAL LOW (ref >60)
Glucose, Bld: 139 mg/dL — ABNORMAL HIGH (ref 70–99)
Potassium: 4.1 mmol/L (ref 3.5–5.1)
Sodium: 138 mmol/L (ref 135–145)

## 2020-03-18 LAB — ABO/RH: ABO/RH(D): A POS

## 2020-03-18 LAB — GLUCOSE, CAPILLARY
Glucose-Capillary: 111 mg/dL — ABNORMAL HIGH (ref 70–99)
Glucose-Capillary: 95 mg/dL (ref 70–99)
Glucose-Capillary: 86 mg/dL (ref 70–99)
Glucose-Capillary: 242 mg/dL — ABNORMAL HIGH (ref 70–99)

## 2020-03-18 LAB — HEMOGLOBIN AND HEMATOCRIT, BLOOD
HCT: 21.2 % — ABNORMAL LOW (ref 36.0–46.0)
Hemoglobin: 6.8 g/dL — CL (ref 12.0–15.0)

## 2020-03-18 LAB — SARS CORONAVIRUS 2 (TAT 6-24 HRS): SARS Coronavirus 2: NEGATIVE

## 2020-03-18 LAB — PREPARE RBC (CROSSMATCH)

## 2020-03-18 MED ORDER — SODIUM CHLORIDE 0.9% IV SOLUTION: Freq: Once | INTRAVENOUS | Status: AC

## 2020-03-18 MED ORDER — PEG 3350-KCL-NA BICARB-NACL 420 G PO SOLR
4000.0000 mL | Freq: Once | ORAL | Status: AC
  Administered 2020-03-18: 4000 mL via ORAL
  Filled 2020-03-18: qty 4000

## 2020-03-18 NOTE — H&P (View-Only) (Signed)
Referring Provider: [facility] Primary Care Physician:  Asencion Noble, MD Primary Gastroenterologist:  Dr. Gala Romney  Date of Admission: 03/17/20 Date of Consultation: 03/18/20  Reason for Consultation:  GI bleed, symptomatic anemia  HPI:  Tammy Dyer is a 87 y.o. female with a past medical history of diverticulosis presented to the emergency department with a couple weeks of increasing amount of bloody stools. She has not seen our service since 2017 for simple constipation and GERD follow-up.  It was discussed at that time that she is not interested in pursuing further colorectal cancer screening.  Last colonoscopy completed 10/20/2003 which found left colonic diverticulosis and small external hemorrhoids, otherwise normal.  EGD completed 10/22/2006 found normal esophagus, small hiatal hernia, otherwise normal stomach, D1, D2.  Her daughter, with who is bedside at the emergency department, notes that bleeding was initially a small amount but 2 days ago began to have significant and increasingly larger amounts of blood as well as dizziness while having a bowel movement. Due to her concerns she presented to her primary care due to rectal exam found a significant amount of blood in the rectal vault for which she was immediately referred to the emergency department. She does note worsening lightheadedness and feelings of near syncope. Also with some dyspnea and weakness. Denies abdominal pain, fever, chills, nausea, vomiting. She does take a baby aspirin but no other NSAIDs or anticoagulants.   In the emergency department her blood pressure was stable, if not a little elevated 301S 010X systolic. However, her heart rate (typically baseline 60-70) was in the 40s. Cardiology has since been consulted. Creatinine elevated 2.34 and subsequent elevation in BUN. Her baseline kidney function is 1.7-1.8.   Labs in the emergency department found hemoglobin of 8 (baseline 10.9 - 11.6 in 2018). CMP  with normal LFTs. INR normal at 1.1. Vit B12 and folate normal. Ferritin low-normal at 20, iron sats low normal at 13%, iron normal at 50. SARS-CoV-2 negative.   Because of rectal bleeding and anemia, felt likely lower GI bleed, GI was consulted. She was started on a clear liquid diet, n.p.o. after midnight. Started on IV Protonix 40 mg daily and IV fluids, transfusion for goal hemoglobin greater than 8 with close monitoring of CBC. Also diagnosed with acute on chronic kidney disease.   Overnight her hemoglobin dropped from 8.5-6.5, recheck hemoglobin confirmed at 6.8 and she was ordered transfusion of 2 units PRBCs.  Precariously her heart rate continues in the 40s to low 50s, her atenolol has been held since admission.  Today she states she has had dark maroon-colored stools for past 2 to 3 weeks.  2 days ago she had a large amount of bleeding for 3 back to back to back stools which was described as bright red.  She felt she was going to pass out while she was on the commode at that time.  She has had bowel movements since she has been admitted and these also had blood in them.  She does feel somewhat better after blood transfusion.  Denies any previous bleeding like this before.  Denies abdominal pain, nausea, vomiting.  Agrees that she has not had a colonoscopy since 2005 or endoscopy since 2008 as described above.  No other overt GI complaints.  She denies any heart issues/history, states she is not having any chest pain, worsening dyspnea.   Past Medical History:  Diagnosis Date  . CRI (chronic renal insufficiency)   . DM (diabetes mellitus) ([place])   . GERD (  gastroesophageal reflux disease)   . HTN (hypertension)   . Hypercholesterolemia     Past Surgical History:  Procedure Laterality Date  . ABDOMINAL HYSTERECTOMY    . CATARACT EXTRACTION    . CHOLECYSTECTOMY    . COLONOSCOPY  2005   [facility]: diverticulosis, hemorrhoids, screening 2015  . ESOPHAGOGASTRODUODENOSCOPY  10/22/06    normal esophagus/small hiatal hernia    Prior to Admission medications   Medication Sig Start Date End Date Taking? Authorizing Provider  acetaminophen (TYLENOL) 500 MG tablet Take 1,000 mg by mouth every 6 (six) hours as needed for mild pain or moderate pain.   Yes [provider]  amLODipine (NORVASC) 5 MG tablet Take 5 mg by mouth daily.   Yes [provider]  aspirin 81 MG tablet Take 81 mg by mouth daily.   Yes [provider]  atenolol (TENORMIN) 25 MG tablet Take 25 mg by mouth daily.   Yes [provider]  atorvastatin (LIPITOR) 20 MG tablet Take 20 mg by mouth every evening. 03/24/11  Yes [provider]  brimonidine (ALPHAGAN P) 0.1 % SOLN Place 1 drop into both eyes in the morning and at bedtime. 03/01/20 03/31/20 Yes [provider]  chlorthalidone (HYGROTON) 25 MG tablet Take 25 mg by mouth daily.   Yes [provider]  glimepiride (AMARYL) 2 MG tablet Take 2 mg by mouth daily before breakfast. 03/12/11  Yes [provider]  LANTUS SOLOSTAR 100 UNIT/ML Solostar Pen Inject 10 Units into the skin every evening. 11/20/19  Yes [provider]  Liniments (BLUE-EMU SUPER STRENGTH) CREA Apply 1 application topically daily as needed (to arm(s) for pain).   Yes [provider]  Melatonin 10 MG TABS Take 10 mg by mouth at bedtime.   Yes [provider]  omeprazole (PRILOSEC) 20 MG capsule Take 1 capsule (20 mg total) by mouth daily. 04/19/15  Yes Rourk, Cristopher Estimable, MD  sertraline (ZOLOFT) 100 MG tablet Take 100 mg by mouth daily. 03/31/11  Yes [provider]  trimethoprim (TRIMPEX) 100 MG tablet Take 100 mg by mouth daily.  03/27/14  Yes [provider]  benzonatate (TESSALON) 200 MG capsule Take 200 mg by mouth 3 (three) times daily as needed for cough. Patient not taking: No sig reported    [provider]  pioglitazone (ACTOS) 30 MG tablet Take 30 mg by mouth daily.  Patient  not taking: No sig reported 03/04/14   [provider]    Current Facility-Administered Medications  Medication Dose Route Frequency Provider Last Rate Last Admin  . 0.9 %  sodium chloride infusion   Intravenous Continuous Emokpae, Ejiroghene E, MD 75 mL/hr at 03/17/20 2011 New Bag at 03/17/20 2011  . acetaminophen (TYLENOL) tablet 650 mg  650 mg Oral Q6H PRN Emokpae, Ejiroghene E, MD   650 mg at 03/17/20 2331   Or  . acetaminophen (TYLENOL) suppository 650 mg  650 mg Rectal Q6H PRN Emokpae, Ejiroghene E, MD      . amLODipine (NORVASC) tablet 10 mg  10 mg Oral Daily Emokpae, Ejiroghene E, MD      . atorvastatin (LIPITOR) tablet 20 mg  20 mg Oral QPM Emokpae, Ejiroghene E, MD   20 mg at 03/17/20 2010  . brimonidine (ALPHAGAN) 0.15 % ophthalmic solution 1 drop  1 drop Both Eyes BID Emokpae, Ejiroghene E, MD   1 drop at 03/18/20 0012  . insulin aspart (novoLOG) injection 0-9 Units  0-9 Units Subcutaneous Q6H Emokpae, Ejiroghene E,  MD   1 Units at 03/17/20 2331  . ondansetron (ZOFRAN) tablet 4 mg  4 mg Oral Q6H PRN Emokpae, Ejiroghene E, MD       Or  . ondansetron (ZOFRAN) injection 4 mg  4 mg Intravenous Q6H PRN Emokpae, Ejiroghene E, MD      . pantoprazole (PROTONIX) injection 40 mg  40 mg Intravenous Q24H Emokpae, Ejiroghene E, MD      . sertraline (ZOLOFT) tablet 100 mg  100 mg Oral Daily Emokpae, Ejiroghene E, MD        Allergies as of 03/17/2020 - Review Complete 03/17/2020  Allergen Reaction Noted  . Codeine  03/15/2010  . Sulfonamide derivatives  03/15/2010    Family History  Problem Relation Age of Onset  . Colon cancer Neg Hx     Social History   Socioeconomic History  . Marital status: Married    Spouse name: Not on file  . Number of children: Not on file  . Years of education: Not on file  . Highest education level: Not on file  Occupational History  . Not on file  Tobacco Use  . Smoking status: Never Smoker  . Smokeless tobacco: Never Used  Vaping Use  .  Vaping Use: Never used  Substance and Sexual Activity  . Alcohol use: No  . Drug use: No  . Sexual activity: Not on file  Other Topics Concern  . Not on file  Social History Narrative  . Not on file   Social Determinants of Health   Financial Resource Strain: Not on file  Food Insecurity: Not on file  Transportation Needs: Not on file  Physical Activity: Not on file  Stress: Not on file  Social Connections: Not on file  Intimate Partner Violence: Not on file    Review of Systems: General: Negative for anorexia, weight loss, fever, chills, fatigue, weakness. ENT: Negative for hoarseness, difficulty swallowing. CV: Negative for chest pain, angina, palpitations, peripheral edema.  Respiratory: Negative for dyspnea at rest, cough, sputum, wheezing.  GI: See history of present illness. Endo: Negative for unusual weight change.  Heme: Negative for bruising or bleeding. Allergy: Negative for rash or hives.  Physical Exam: Vital signs in last 24 hours: Temp:  [97.6 F (36.4 C)-98.1 F (36.7 C)] 98.1 F (36.7 C) (02/25 0617) Pulse Rate:  [40-56] 56 (02/25 0617) Resp:  [15-20] 16 (02/25 0617) BP: (126-202)/(44-138) 162/57 (02/25 0617) SpO2:  [97 %-100 %] 97 % (02/25 0617) Weight:  [64.4 kg-65 kg] 65 kg (02/24 2049)   General:   Alert,  Well-developed, well-nourished, pleasant and cooperative in NAD Head:  Normocephalic and atraumatic. Eyes:  Sclera clear, no icterus. Conjunctiva pink. Ears:  Normal auditory acuity. Neck:  Supple; no masses or thyromegaly. Lungs:  Clear throughout to auscultation.   No wheezes, crackles, or rhonchi. No acute distress. Heart:  Regular rate and rhythm; no murmurs, clicks, rubs,  or gallops. Abdomen:  Soft, nontender and nondistended. No masses, hepatosplenomegaly or hernias noted. Normal bowel sounds, without guarding, and without rebound.   Rectal:  Deferred until time of colonoscopy.   Msk:  Symmetrical without gross deformities. Pulses:   Normal bilateral DP pulses noted. Extremities:  Without clubbing or edema. Neurologic:  Alert and  oriented x4;  grossly normal neurologically. Psych:  Alert and cooperative. Normal mood and affect.  Intake/Output from previous day: 02/24 0701 - 02/25 0700 In: 1580.6 [P.O.:240; I.V.:740.6; IV Piggyback:600] Out: -  Intake/Output this shift: No intake/output data recorded.  Lab  Results: Recent Labs    03/17/20 1528 03/17/20 2009 03/18/20 0105 03/18/20 0409  WBC 6.6 7.8 7.0  --   HGB 8.0* 8.5* 6.5* 6.8*  HCT 25.0* 27.7* 20.2* 21.2*  PLT 176 166 150  --    BMET Recent Labs    03/17/20 1528 03/18/20 0409  NA 137 138  K 4.2 4.1  CL 108 112*  CO2 21* 20*  GLUCOSE 164* 139*  BUN 51* 42*  CREATININE 2.34* 2.03*  CALCIUM 8.8* 8.1*   LFT Recent Labs    03/17/20 1528  PROT 6.4*  ALBUMIN 3.6  AST 15  ALT 13  ALKPHOS 58  BILITOT 0.4   PT/INR Recent Labs    03/17/20 1528  LABPROT 14.0  INR 1.1   Hepatitis Panel No results for input(s): HEPBSAG, HCVAB, HEPAIGM, HEPBIGM in the last 72 hours. C-Diff No results for input(s): CDIFFTOX in the last 72 hours.  Studies/Results: No results found.  Impression: Pleasant 87 year old female presents with a history of diverticulosis, most recent normal endoscopic evaluation with colonoscopy in 2005 and EGD in 2008, both essentially normal.  Only GI significant history of GERD and constipation.  She presented from primary care office where she was seen for 2 weeks of progressively worsening bleeding.  Rectal exam in primary care office found significant blood in the rectal vault and she was subsequently referred to the emergency department.    GI Bleed: The patient admits significant symptoms of anemia including weakness, fatigue, near syncope and her initial hemoglobin in the emergency department was 8.5, which declined to 6.5 overnight.  She has never had bleeding like this before.  BUN is elevated however she does have altered  kidney function so this is not particularly indicative of potential etiology.  Given her bright red blood, I feel it is likely lower GI bleed, although cannot rule out rapid transit small bowel bleed or even potentially upper GI bleed.  Differentials include diverticular bleed (although we would not expect this to prolonged for 2+ weeks), colonic ulceration, small bowel AVMs (especially in the terminal ileum), benign anorectal source, bleeding polyp.  Less likely inflammatory bowel disease.  Cannot rule out malignant process.  Of note, I spoke with Cardiology who states if she is off her beta blocker, no identified heart block, and asmptomatic bradycardia then she can have endoscopic procedure. I will have nursing complete a 12 lead EKG to ensure no heart block (though none obvious on telemetry)   Plan: 1. Monitor hgb closely 2. Transfuse as necessary 3. EKG 4. Will likely need colonoscopy and EGD while admitted after resuscitation from her significant anemic state and evaluation of heart rate via EKG. 5. Supportive care   Thank you for allowing [place] to participate in the care of [place], DNP, AGNP-C Adult & Gerontological Nurse Practitioner [facility]   LOS: 0 days     03/18/2020, 7:30 AM

## 2020-03-18 NOTE — Progress Notes (Addendum)
PROGRESS NOTE  Tammy Dyer LPF:[id number] DOB: Jun 20, 1933 DOA: 03/17/2020 PCP: Asencion Noble, MD  Brief History:  87 year old female with a history of diabetes mellitus type 2, hyperlipidemia, diverticulosis, GERD, CKD stage III presenting with hematochezia over the past month. Apparently, the patient has had some intermittent hematochezia which has gradually become more frequent. On 03/15/2020, the patient noted three bloody bowel movements. She also has been feeling somewhat dizzy and had a presyncopal episode. The next day, she did not have any further bowel movements, but on the evening of 03/16/2020, the patient had another bloody bowel movement with dizziness. She went to see her PCP on 03/17/2020. Rectal exam showed blood in the rectal vault, and the patient was directed to the emergency department for further evaluation. The patient denies any chest pain, but has noted some mild increase in dyspnea on exertion. She denies any fevers, chills, syncope, nausea, vomiting, diarrhea. She denies any NSAID use. Patient has had colonoscopy in the distant past, but cannot remember how long ago. In the emergency department, the patient was afebrile and hemodynamically stable with oxygen saturation 99% room air. BMP showed a sodium 138, potassium 4.1, serum creatinine 2.34. LFTs were unremarkable. WBC 6.6, hemoglobin 8.0, platelets 176,000. GI was consulted to assist with management.  Assessment/Plan: Hematochezia/ABLA -drop in Hgb partly dilutional and equilibration -suspect diverticular bleed -GI consulted -continue PPI -clear liquid diet for now -transfusing 1 unit PRBC  Acute on chronic renal failure--CKD stage 3b -due to volume depletion in the setting of chlorthalidone  -1 unit PRBC transfused -judicious IVF  Sinuse Bradycardia -no pauses on telemetry -holding atenolol  Diabetes Mellitus type 2, controlled -03/17/20 A1C--7.1 -holding amaryl and actos -novolog sliding scale for  now  Hyperlipidemia -continue statin  Essential Hypertension -continue amlodipine -holding chlorthalidone due to AKI  Depression -continue sertraline     Status is: Observation  The patient will require care spanning > 2 midnights and should be moved to inpatient because: IV treatments appropriate due to intensity of illness or inability to take PO  Dispo: The patient is from: Home              Anticipated d/c is to: Home              Patient currently is not medically stable to d/c.   Difficult to place patient No        Family Communication:  Attempted to call daughter x 2--could not leave voicemail  Consultants:  GI  Code Status:  FULL  DVT Prophylaxis:  SCDs   Procedures: As Listed in Progress Note Above  Antibiotics: None      Subjective: Patient had one bloody BM overnight.  She has intermittent epigastric abd pain. Denies f/c, cp, sob, n/v  Objective: Vitals:   03/18/20 0554 03/18/20 0559 03/18/20 0614 03/18/20 0617  BP: (!) 168/46 (!) 168/46 (!) 162/57 (!) 162/57  Pulse: (!) 46 (!) 46 (!) 56 (!) 56  Resp: 18 18 16 16   Temp: 98.1 F (36.7 C) 98.1 F (36.7 C) 98.1 F (36.7 C) 98.1 F (36.7 C)  TempSrc: Oral Oral  Oral  SpO2: 99%  97% 97%  Weight:      Height:        Intake/Output Summary (Last 24 hours) at 03/18/2020 0832 Last data filed at 03/18/2020 0600 Gross per 24 hour  Intake 1580.58 ml  Output -  Net 1580.58 ml   Weight change:  Exam:  General:  Pt is alert, follows commands appropriately, not in acute distress  HEENT: No icterus, No thrush, No neck mass, [place]/AT  Cardiovascular: RRR, S1/S2, no rubs, no gallops  Respiratory: CTA bilaterally, no wheezing, no crackles, no rhonchi  Abdomen: Soft/+BS, epigastric tender, non distended, no guarding  Extremities: No edema, No lymphangitis, No petechiae, No rashes, no synovitis   Data Reviewed: I have personally reviewed following labs and imaging studies Basic Metabolic  Panel: Recent Labs  Lab 03/17/20 1528 03/18/20 0409  NA 137 138  K 4.2 4.1  CL 108 112*  CO2 21* 20*  GLUCOSE 164* 139*  BUN 51* 42*  CREATININE 2.34* 2.03*  CALCIUM 8.8* 8.1*   Liver Function Tests: Recent Labs  Lab 03/17/20 1528  AST 15  ALT 13  ALKPHOS 58  BILITOT 0.4  PROT 6.4*  ALBUMIN 3.6   No results for input(s): LIPASE, AMYLASE in the last 168 hours. No results for input(s): AMMONIA in the last 168 hours. Coagulation Profile: Recent Labs  Lab 03/17/20 1528  INR 1.1   CBC: Recent Labs  Lab 03/17/20 1528 03/17/20 2009 03/18/20 0105 03/18/20 0409  WBC 6.6 7.8 7.0  --   NEUTROABS 4.7  --   --   --   HGB 8.0* 8.5* 6.5* 6.8*  HCT 25.0* 27.7* 20.2* 21.2*  MCV 94.7 98.9 94.4  --   PLT 176 166 150  --    Cardiac Enzymes: No results for input(s): CKTOTAL, CKMB, CKMBINDEX, TROPONINI in the last 168 hours. BNP: Invalid input(s): POCBNP CBG: Recent Labs  Lab 03/17/20 2204 03/18/20 0523 03/18/20 0746  GLUCAP 144* 111* 95   HbA1C: Recent Labs    03/17/20 1520  HGBA1C 7.1*   Urine analysis:    Component Value Date/Time   COLORURINE AMBER (A) 02/07/2016 1600   APPEARANCEUR CLEAR 02/07/2016 1600   LABSPEC 1.011 02/07/2016 1600   PHURINE 5.0 02/07/2016 1600   GLUCOSEU NEGATIVE 02/07/2016 1600   HGBUR MODERATE (A) 02/07/2016 1600   BILIRUBINUR NEGATIVE 02/07/2016 1600   KETONESUR NEGATIVE 02/07/2016 1600   PROTEINUR 30 (A) 02/07/2016 1600   NITRITE POSITIVE (A) 02/07/2016 1600   LEUKOCYTESUR SMALL (A) 02/07/2016 1600   Sepsis Labs: @LABRCNTIP (procalcitonin:4,lacticidven:4) ) Recent Results (from the past 240 hour(s))  SARS CORONAVIRUS 2 (TAT 6-24 HRS) Nasopharyngeal Nasopharyngeal Swab     Status: None   Collection Time: 03/17/20  3:58 PM   Specimen: Nasopharyngeal Swab  Result Value Ref Range Status   SARS Coronavirus 2 NEGATIVE NEGATIVE Final    Comment: (NOTE) SARS-CoV-2 target nucleic acids are NOT DETECTED.  The SARS-CoV-2 RNA is  generally detectable in upper and lower respiratory specimens during the acute phase of infection. Negative results do not preclude SARS-CoV-2 infection, do not rule out co-infections with other pathogens, and should not be used as the sole basis for treatment or other patient management decisions. Negative results must be combined with clinical observations, patient history, and epidemiological information. The expected result is Negative.  Fact Sheet for Patients: SugarRoll.be  Fact Sheet for Healthcare Providers: https://www.woods-mathews.com/  This test is not yet approved or cleared by the [place] FDA and  has been authorized for detection and/or diagnosis of SARS-CoV-2 by FDA under an Emergency Use Authorization (EUA). This EUA will remain  in effect (meaning this test can be used) for the duration of the COVID-19 declaration under Se ction 564(b)(1) of the Act, 21 U.S.C. section 360bbb-3(b)(1), unless the authorization is terminated or revoked sooner.  Performed at  [facility], [place] [address] [place], [place] [address]      Scheduled Meds: . amLODipine  10 mg Oral Daily  . atorvastatin  20 mg Oral QPM  . brimonidine  1 drop Both Eyes BID  . insulin aspart  0-9 Units Subcutaneous Q6H  . pantoprazole (PROTONIX) IV  40 mg Intravenous Q24H  . sertraline  100 mg Oral Daily   Continuous Infusions: . sodium chloride 75 mL/hr at 03/17/20 2011    Procedures/Studies: No results found.  Orson Eva, DO  [facility]  If 7PM-7AM, please contact night-coverage [email or web address] Password [facility] 03/18/2020, 8:32 AM   LOS: 0 days

## 2020-03-18 NOTE — Consult Note (Signed)
Referring Provider: [facility] Primary Care Physician:  Asencion Noble, MD Primary Gastroenterologist:  Dr. Gala Romney  Date of Admission: 03/17/20 Date of Consultation: 03/18/20  Reason for Consultation:  GI bleed, symptomatic anemia  HPI:  Tammy Dyer is a 87 y.o. female with a past medical history of diverticulosis presented to the emergency department with a couple weeks of increasing amount of bloody stools. She has not seen our service since 2017 for simple constipation and GERD follow-up.  It was discussed at that time that she is not interested in pursuing further colorectal cancer screening.  Last colonoscopy completed 10/20/2003 which found left colonic diverticulosis and small external hemorrhoids, otherwise normal.  EGD completed 10/22/2006 found normal esophagus, small hiatal hernia, otherwise normal stomach, D1, D2.  Her daughter, with who is bedside at the emergency department, notes that bleeding was initially a small amount but 2 days ago began to have significant and increasingly larger amounts of blood as well as dizziness while having a bowel movement. Due to her concerns she presented to her primary care due to rectal exam found a significant amount of blood in the rectal vault for which she was immediately referred to the emergency department. She does note worsening lightheadedness and feelings of near syncope. Also with some dyspnea and weakness. Denies abdominal pain, fever, chills, nausea, vomiting. She does take a baby aspirin but no other NSAIDs or anticoagulants.   In the emergency department her blood pressure was stable, if not a little elevated 841L 244W systolic. However, her heart rate (typically baseline 60-70) was in the 40s. Cardiology has since been consulted. Creatinine elevated 2.34 and subsequent elevation in BUN. Her baseline kidney function is 1.7-1.8.   Labs in the emergency department found hemoglobin of 8 (baseline 10.9 - 11.6 in 2018). CMP  with normal LFTs. INR normal at 1.1. Vit B12 and folate normal. Ferritin low-normal at 20, iron sats low normal at 13%, iron normal at 50. SARS-CoV-2 negative.   Because of rectal bleeding and anemia, felt likely lower GI bleed, GI was consulted. She was started on a clear liquid diet, n.p.o. after midnight. Started on IV Protonix 40 mg daily and IV fluids, transfusion for goal hemoglobin greater than 8 with close monitoring of CBC. Also diagnosed with acute on chronic kidney disease.   Overnight her hemoglobin dropped from 8.5-6.5, recheck hemoglobin confirmed at 6.8 and she was ordered transfusion of 2 units PRBCs.  Precariously her heart rate continues in the 40s to low 50s, her atenolol has been held since admission.  Today she states she has had dark maroon-colored stools for past 2 to 3 weeks.  2 days ago she had a large amount of bleeding for 3 back to back to back stools which was described as bright red.  She felt she was going to pass out while she was on the commode at that time.  She has had bowel movements since she has been admitted and these also had blood in them.  She does feel somewhat better after blood transfusion.  Denies any previous bleeding like this before.  Denies abdominal pain, nausea, vomiting.  Agrees that she has not had a colonoscopy since 2005 or endoscopy since 2008 as described above.  No other overt GI complaints.  She denies any heart issues/history, states she is not having any chest pain, worsening dyspnea.   Past Medical History:  Diagnosis Date  . CRI (chronic renal insufficiency)   . DM (diabetes mellitus) ([place])   . GERD (  gastroesophageal reflux disease)   . HTN (hypertension)   . Hypercholesterolemia     Past Surgical History:  Procedure Laterality Date  . ABDOMINAL HYSTERECTOMY    . CATARACT EXTRACTION    . CHOLECYSTECTOMY    . COLONOSCOPY  2005   [facility]: diverticulosis, hemorrhoids, screening 2015  . ESOPHAGOGASTRODUODENOSCOPY  10/22/06    normal esophagus/small hiatal hernia    Prior to Admission medications   Medication Sig Start Date End Date Taking? Authorizing Provider  acetaminophen (TYLENOL) 500 MG tablet Take 1,000 mg by mouth every 6 (six) hours as needed for mild pain or moderate pain.   Yes [provider]  amLODipine (NORVASC) 5 MG tablet Take 5 mg by mouth daily.   Yes [provider]  aspirin 81 MG tablet Take 81 mg by mouth daily.   Yes [provider]  atenolol (TENORMIN) 25 MG tablet Take 25 mg by mouth daily.   Yes [provider]  atorvastatin (LIPITOR) 20 MG tablet Take 20 mg by mouth every evening. 03/24/11  Yes [provider]  brimonidine (ALPHAGAN P) 0.1 % SOLN Place 1 drop into both eyes in the morning and at bedtime. 03/01/20 03/31/20 Yes [provider]  chlorthalidone (HYGROTON) 25 MG tablet Take 25 mg by mouth daily.   Yes [provider]  glimepiride (AMARYL) 2 MG tablet Take 2 mg by mouth daily before breakfast. 03/12/11  Yes [provider]  LANTUS SOLOSTAR 100 UNIT/ML Solostar Pen Inject 10 Units into the skin every evening. 11/20/19  Yes [provider]  Liniments (BLUE-EMU SUPER STRENGTH) CREA Apply 1 application topically daily as needed (to arm(s) for pain).   Yes [provider]  Melatonin 10 MG TABS Take 10 mg by mouth at bedtime.   Yes [provider]  omeprazole (PRILOSEC) 20 MG capsule Take 1 capsule (20 mg total) by mouth daily. 04/19/15  Yes Rourk, Cristopher Estimable, MD  sertraline (ZOLOFT) 100 MG tablet Take 100 mg by mouth daily. 03/31/11  Yes [provider]  trimethoprim (TRIMPEX) 100 MG tablet Take 100 mg by mouth daily.  03/27/14  Yes [provider]  benzonatate (TESSALON) 200 MG capsule Take 200 mg by mouth 3 (three) times daily as needed for cough. Patient not taking: No sig reported    [provider]  pioglitazone (ACTOS) 30 MG tablet Take 30 mg by mouth daily.  Patient  not taking: No sig reported 03/04/14   [provider]    Current Facility-Administered Medications  Medication Dose Route Frequency Provider Last Rate Last Admin  . 0.9 %  sodium chloride infusion   Intravenous Continuous Emokpae, Ejiroghene E, MD 75 mL/hr at 03/17/20 2011 New Bag at 03/17/20 2011  . acetaminophen (TYLENOL) tablet 650 mg  650 mg Oral Q6H PRN Emokpae, Ejiroghene E, MD   650 mg at 03/17/20 2331   Or  . acetaminophen (TYLENOL) suppository 650 mg  650 mg Rectal Q6H PRN Emokpae, Ejiroghene E, MD      . amLODipine (NORVASC) tablet 10 mg  10 mg Oral Daily Emokpae, Ejiroghene E, MD      . atorvastatin (LIPITOR) tablet 20 mg  20 mg Oral QPM Emokpae, Ejiroghene E, MD   20 mg at 03/17/20 2010  . brimonidine (ALPHAGAN) 0.15 % ophthalmic solution 1 drop  1 drop Both Eyes BID Emokpae, Ejiroghene E, MD   1 drop at 03/18/20 0012  . insulin aspart (novoLOG) injection 0-9 Units  0-9 Units Subcutaneous Q6H Emokpae, Ejiroghene E,  MD   1 Units at 03/17/20 2331  . ondansetron (ZOFRAN) tablet 4 mg  4 mg Oral Q6H PRN Emokpae, Ejiroghene E, MD       Or  . ondansetron (ZOFRAN) injection 4 mg  4 mg Intravenous Q6H PRN Emokpae, Ejiroghene E, MD      . pantoprazole (PROTONIX) injection 40 mg  40 mg Intravenous Q24H Emokpae, Ejiroghene E, MD      . sertraline (ZOLOFT) tablet 100 mg  100 mg Oral Daily Emokpae, Ejiroghene E, MD        Allergies as of 03/17/2020 - Review Complete 03/17/2020  Allergen Reaction Noted  . Codeine  03/15/2010  . Sulfonamide derivatives  03/15/2010    Family History  Problem Relation Age of Onset  . Colon cancer Neg Hx     Social History   Socioeconomic History  . Marital status: Married    Spouse name: Not on file  . Number of children: Not on file  . Years of education: Not on file  . Highest education level: Not on file  Occupational History  . Not on file  Tobacco Use  . Smoking status: Never Smoker  . Smokeless tobacco: Never Used  Vaping Use  .  Vaping Use: Never used  Substance and Sexual Activity  . Alcohol use: No  . Drug use: No  . Sexual activity: Not on file  Other Topics Concern  . Not on file  Social History Narrative  . Not on file   Social Determinants of Health   Financial Resource Strain: Not on file  Food Insecurity: Not on file  Transportation Needs: Not on file  Physical Activity: Not on file  Stress: Not on file  Social Connections: Not on file  Intimate Partner Violence: Not on file    Review of Systems: General: Negative for anorexia, weight loss, fever, chills, fatigue, weakness. ENT: Negative for hoarseness, difficulty swallowing. CV: Negative for chest pain, angina, palpitations, peripheral edema.  Respiratory: Negative for dyspnea at rest, cough, sputum, wheezing.  GI: See history of present illness. Endo: Negative for unusual weight change.  Heme: Negative for bruising or bleeding. Allergy: Negative for rash or hives.  Physical Exam: Vital signs in last 24 hours: Temp:  [97.6 F (36.4 C)-98.1 F (36.7 C)] 98.1 F (36.7 C) (02/25 0617) Pulse Rate:  [40-56] 56 (02/25 0617) Resp:  [15-20] 16 (02/25 0617) BP: (126-202)/(44-138) 162/57 (02/25 0617) SpO2:  [97 %-100 %] 97 % (02/25 0617) Weight:  [64.4 kg-65 kg] 65 kg (02/24 2049)   General:   Alert,  Well-developed, well-nourished, pleasant and cooperative in NAD Head:  Normocephalic and atraumatic. Eyes:  Sclera clear, no icterus. Conjunctiva pink. Ears:  Normal auditory acuity. Neck:  Supple; no masses or thyromegaly. Lungs:  Clear throughout to auscultation.   No wheezes, crackles, or rhonchi. No acute distress. Heart:  Regular rate and rhythm; no murmurs, clicks, rubs,  or gallops. Abdomen:  Soft, nontender and nondistended. No masses, hepatosplenomegaly or hernias noted. Normal bowel sounds, without guarding, and without rebound.   Rectal:  Deferred until time of colonoscopy.   Msk:  Symmetrical without gross deformities. Pulses:   Normal bilateral DP pulses noted. Extremities:  Without clubbing or edema. Neurologic:  Alert and  oriented x4;  grossly normal neurologically. Psych:  Alert and cooperative. Normal mood and affect.  Intake/Output from previous day: 02/24 0701 - 02/25 0700 In: 1580.6 [P.O.:240; I.V.:740.6; IV Piggyback:600] Out: -  Intake/Output this shift: No intake/output data recorded.  Lab  Results: Recent Labs    03/17/20 1528 03/17/20 2009 03/18/20 0105 03/18/20 0409  WBC 6.6 7.8 7.0  --   HGB 8.0* 8.5* 6.5* 6.8*  HCT 25.0* 27.7* 20.2* 21.2*  PLT 176 166 150  --    BMET Recent Labs    03/17/20 1528 03/18/20 0409  NA 137 138  K 4.2 4.1  CL 108 112*  CO2 21* 20*  GLUCOSE 164* 139*  BUN 51* 42*  CREATININE 2.34* 2.03*  CALCIUM 8.8* 8.1*   LFT Recent Labs    03/17/20 1528  PROT 6.4*  ALBUMIN 3.6  AST 15  ALT 13  ALKPHOS 58  BILITOT 0.4   PT/INR Recent Labs    03/17/20 1528  LABPROT 14.0  INR 1.1   Hepatitis Panel No results for input(s): HEPBSAG, HCVAB, HEPAIGM, HEPBIGM in the last 72 hours. C-Diff No results for input(s): CDIFFTOX in the last 72 hours.  Studies/Results: No results found.  Impression: Pleasant 87 year old female presents with a history of diverticulosis, most recent normal endoscopic evaluation with colonoscopy in 2005 and EGD in 2008, both essentially normal.  Only GI significant history of GERD and constipation.  She presented from primary care office where she was seen for 2 weeks of progressively worsening bleeding.  Rectal exam in primary care office found significant blood in the rectal vault and she was subsequently referred to the emergency department.    GI Bleed: The patient admits significant symptoms of anemia including weakness, fatigue, near syncope and her initial hemoglobin in the emergency department was 8.5, which declined to 6.5 overnight.  She has never had bleeding like this before.  BUN is elevated however she does have altered  kidney function so this is not particularly indicative of potential etiology.  Given her bright red blood, I feel it is likely lower GI bleed, although cannot rule out rapid transit small bowel bleed or even potentially upper GI bleed.  Differentials include diverticular bleed (although we would not expect this to prolonged for 2+ weeks), colonic ulceration, small bowel AVMs (especially in the terminal ileum), benign anorectal source, bleeding polyp.  Less likely inflammatory bowel disease.  Cannot rule out malignant process.  Of note, I spoke with Cardiology who states if she is off her beta blocker, no identified heart block, and asmptomatic bradycardia then she can have endoscopic procedure. I will have nursing complete a 12 lead EKG to ensure no heart block (though none obvious on telemetry)   Plan: 1. Monitor hgb closely 2. Transfuse as necessary 3. EKG 4. Will likely need colonoscopy and EGD while admitted after resuscitation from her significant anemic state and evaluation of heart rate via EKG. 5. Supportive care   Thank you for allowing [place] to participate in the care of Hester, DNP, AGNP-C Adult & Gerontological Nurse Practitioner [facility]   LOS: 0 days     03/18/2020, 7:30 AM

## 2020-03-18 NOTE — Progress Notes (Signed)
1:51 AM RN called to report that patient's hemoglobin dropped to 6.5 from 8.0 on admission.  Repeat H/H was done and showed 6.8/21.2.  Type and screen was already done, 2 units of PRBCs ordered with plan to infuse 1 unit at this time.

## 2020-03-18 NOTE — Progress Notes (Signed)
Date and time results received: 03/18/20;0157  Test: Hemoglobin Critical Value: 6.5  Name of Provider Notified: Dr. Josephine Cables  Orders Received? Or Actions Taken? Per MD, lab to draw another H&H before starting transfusion

## 2020-03-18 NOTE — Progress Notes (Addendum)
Pt lying in bed resting heart rate fluctuating between 37-46, denies distress. Hospitalist made aware. Call bell in reach.

## 2020-03-19 ENCOUNTER — Other Ambulatory Visit: Payer: Self-pay

## 2020-03-19 ENCOUNTER — Encounter (HOSPITAL_COMMUNITY)
Admission: EM | Disposition: A | Discharge status: Home / Self Care | Payer: Self-pay | Source: Home / Self Care | Attending: Internal Medicine

## 2020-03-19 ENCOUNTER — Inpatient Hospital Stay (HOSPITAL_COMMUNITY): Payer: PPO | Admitting: Anesthesiology

## 2020-03-19 ENCOUNTER — Encounter (HOSPITAL_COMMUNITY): Payer: Self-pay | Admitting: Internal Medicine

## 2020-03-19 DIAGNOSIS — K2970 Gastritis, unspecified, without bleeding: Secondary | ICD-10-CM

## 2020-03-19 DIAGNOSIS — K317 Polyp of stomach and duodenum: Secondary | ICD-10-CM

## 2020-03-19 DIAGNOSIS — K922 Gastrointestinal hemorrhage, unspecified: Secondary | ICD-10-CM

## 2020-03-19 DIAGNOSIS — K5521 Angiodysplasia of colon with hemorrhage: Secondary | ICD-10-CM

## 2020-03-19 DIAGNOSIS — N184 Chronic kidney disease, stage 4 (severe): Secondary | ICD-10-CM

## 2020-03-19 DIAGNOSIS — D62 Acute posthemorrhagic anemia: Secondary | ICD-10-CM

## 2020-03-19 HISTORY — PX: ESOPHAGOGASTRODUODENOSCOPY (EGD) WITH PROPOFOL: SHX5813

## 2020-03-19 HISTORY — PX: POLYPECTOMY: SHX5525

## 2020-03-19 HISTORY — PX: HOT HEMOSTASIS: SHX5433

## 2020-03-19 HISTORY — PX: COLONOSCOPY WITH PROPOFOL: SHX5780

## 2020-03-19 LAB — CBC
HCT: 27.9 % — ABNORMAL LOW (ref 36.0–46.0)
HCT: 29.9 % — ABNORMAL LOW (ref 36.0–46.0)
Hemoglobin: 8.9 g/dL — ABNORMAL LOW (ref 12.0–15.0)
Hemoglobin: 9.7 g/dL — ABNORMAL LOW (ref 12.0–15.0)
MCH: 29.9 pg (ref 26.0–34.0)
MCH: 30.4 pg (ref 26.0–34.0)
MCHC: 31.9 g/dL (ref 30.0–36.0)
MCHC: 32.4 g/dL (ref 30.0–36.0)
MCV: 93.6 fL (ref 80.0–100.0)
MCV: 93.7 fL (ref 80.0–100.0)
Platelets: 137 10*3/uL — ABNORMAL LOW (ref 150–400)
Platelets: 145 10*3/uL — ABNORMAL LOW (ref 150–400)
RBC: 2.98 MIL/uL — ABNORMAL LOW (ref 3.87–5.11)
RBC: 3.19 MIL/uL — ABNORMAL LOW (ref 3.87–5.11)
RDW: 13.8 % (ref 11.5–15.5)
RDW: 13.8 % (ref 11.5–15.5)
WBC: 6.8 10*3/uL (ref 4.0–10.5)
WBC: 7.5 10*3/uL (ref 4.0–10.5)
nRBC: 0 % (ref 0.0–0.2)
nRBC: 0 % (ref 0.0–0.2)

## 2020-03-19 LAB — GLUCOSE, CAPILLARY
Glucose-Capillary: 150 mg/dL — ABNORMAL HIGH (ref 70–99)
Glucose-Capillary: 101 mg/dL — ABNORMAL HIGH (ref 70–99)
Glucose-Capillary: 105 mg/dL — ABNORMAL HIGH (ref 70–99)
Glucose-Capillary: 149 mg/dL — ABNORMAL HIGH (ref 70–99)
Glucose-Capillary: 148 mg/dL — ABNORMAL HIGH (ref 70–99)
Glucose-Capillary: 166 mg/dL — ABNORMAL HIGH (ref 70–99)

## 2020-03-19 LAB — TYPE AND SCREEN: ABO/RH(D): A POS

## 2020-03-19 LAB — MAGNESIUM: Magnesium: 1.7 mg/dL (ref 1.7–2.4)

## 2020-03-19 SURGERY — COLONOSCOPY WITH PROPOFOL
Anesthesia: General

## 2020-03-19 MED ORDER — INSULIN ASPART 100 UNIT/ML ~~LOC~~ SOLN
0.0000 [IU] | Freq: Four times a day (QID) | SUBCUTANEOUS | Status: DC
  Administered 2020-03-19: 1 [IU] via SUBCUTANEOUS
  Administered 2020-03-19: 2 [IU] via SUBCUTANEOUS
  Administered 2020-03-19: 1 [IU] via SUBCUTANEOUS

## 2020-03-19 MED ORDER — HYDRALAZINE HCL 20 MG/ML IJ SOLN
INTRAMUSCULAR | Status: AC
  Filled 2020-03-19: qty 1

## 2020-03-19 MED ORDER — SODIUM CHLORIDE FLUSH 0.9 % IV SOLN
INTRAVENOUS | Status: AC
  Filled 2020-03-19: qty 10

## 2020-03-19 MED ORDER — SODIUM CHLORIDE 0.9 % IV SOLN: INTRAVENOUS | Status: DC

## 2020-03-19 MED ORDER — PROPOFOL 10 MG/ML IV BOLUS
INTRAVENOUS | Status: DC | PRN
  Administered 2020-03-19: 100 mg via INTRAVENOUS
  Administered 2020-03-19 (×3): 50 mg via INTRAVENOUS

## 2020-03-19 MED ORDER — HYDRALAZINE HCL 20 MG/ML IJ SOLN
INTRAMUSCULAR | Status: DC | PRN
  Administered 2020-03-19: 2 mg via INTRAVENOUS

## 2020-03-19 MED ORDER — GLYCOPYRROLATE 0.2 MG/ML IJ SOLN
INTRAMUSCULAR | Status: DC | PRN
  Administered 2020-03-19: .1 mg via INTRAVENOUS

## 2020-03-19 MED ORDER — PROPOFOL 10 MG/ML IV BOLUS
INTRAVENOUS | Status: AC
  Filled 2020-03-19: qty 40

## 2020-03-19 MED ORDER — STERILE WATER FOR IRRIGATION IR SOLN
Status: DC | PRN
  Administered 2020-03-19: 100 mL

## 2020-03-19 NOTE — Transfer of Care (Signed)
Immediate Anesthesia Transfer of Care Note  Patient: Tammy Dyer [facility]  Procedure(s) Performed: COLONOSCOPY WITH PROPOFOL (N/A ) POLYPECTOMY HOT HEMOSTASIS (ARGON PLASMA COAGULATION/BICAP) ESOPHAGOGASTRODUODENOSCOPY (EGD) WITH PROPOFOL (N/A )  Patient Location: PACU  Anesthesia Type:General  Level of Consciousness: awake  Airway & Oxygen Therapy: Patient Spontanous Breathing  Post-op Assessment: Report given to RN and Post -op Vital signs reviewed and stable  Post vital signs: Reviewed and stable  Last Vitals:  Vitals Value Taken Time  BP 166/64 03/19/20 1000  Temp 36.8 C 03/19/20 0952  Pulse 62 03/19/20 1007  Resp 15 03/19/20 1007  SpO2 98 % 03/19/20 1007  Vitals shown include unvalidated device data.  Last Pain:  Vitals:   03/19/20 0952  TempSrc:   PainSc: Asleep         Complications: No complications documented.

## 2020-03-19 NOTE — Op Note (Signed)
[facility] Patient Name: Tammy Dyer Procedure Date: 03/19/2020 9:25 AM MRN: [id number] Date of Birth: Apr 21, 1933 Attending MD: Elon Alas. Abbey Chatters DO CSN: [id number] Age: 87 Admit Type: Outpatient Procedure:                Colonoscopy Indications:              Hematochezia Providers:                Elon Alas. Abbey Chatters, DO, Flippin Page, [place]                            Risa Grill, Technician Referring MD:              Medicines:                See the Anesthesia note for documentation of the                            administered medications Complications:            No immediate complications. Estimated Blood Loss:     Estimated blood loss: none. Estimated blood loss                            was minimal. Procedure:                Pre-Anesthesia Assessment:                           - The anesthesia plan was to use monitored                            anesthesia care (MAC).                           After obtaining informed consent, the colonoscope                            was passed under direct vision. Throughout the                            procedure, the patient's blood pressure, pulse, and                            oxygen saturations were monitored continuously. The                            [id number]-HQ190L ([id number]) scope was introduced through                            the anus and advanced to the the cecum, identified                            by appendiceal orifice and ileocecal valve. The                            colonoscopy was performed without difficulty. The  patient tolerated the procedure well. The quality                            of the bowel preparation was evaluated using the                            BBPS [facility] Bowel Preparation Scale) with scores                            of: Right Colon = 3, Transverse Colon = 3 and Left                            Colon = 3 (entire mucosa seen well with no residual                             staining, small fragments of stool or opaque                            liquid). The total BBPS score equals 9. Scope In: 9:27:36 AM Scope Out: 9:46:15 AM Scope Withdrawal Time: 0 hours 11 minutes 50 seconds  Total Procedure Duration: 0 hours 18 minutes 39 seconds  Findings:      The perianal and digital rectal examinations were normal.      Non-bleeding internal hemorrhoids were found during endoscopy.      Multiple small and large-mouthed diverticula were found in the sigmoid       colon and descending colon.      Two small angioectasias with stigmata of recent bleeding were found in       the ascending colon. Coagulation for bleeding prevention using argon       plasma at 0.3 liters/minute and 25 watts was successful.      The exam was otherwise without abnormality. Impression:               - Non-bleeding internal hemorrhoids.                           - Diverticulosis in the sigmoid colon and in the                            descending colon.                           - Two recently bleeding colonic angioectasias.                            Treated with argon plasma coagulation (APC).                           - The examination was otherwise normal.                           - No specimens collected. Moderate Sedation:      Per Anesthesia Care Recommendation:           - Return patient to hospital ward for ongoing care.                           -  Etiology of patient's bleeding likely either                            diverticular or AVMs which I treated as above. Okay                            to start on a diet today. Continue to monitor her                            today. If she contineus to do well until tomorrow                            morning then she should be ready for discharge. I                            discussed with her daughter who is in agreement. I                            will continue to follow her and arrange outpatient                             follow-up with GI. Procedure Code(s):        --- Professional ---                           [phone], Colonoscopy, flexible; with control of                            bleeding, any method Diagnosis Code(s):        --- Professional ---                           K64.8, Other hemorrhoids                           K55.21, Angiodysplasia of colon with hemorrhage                           K92.1, Melena (includes Hematochezia)                           K57.30, Diverticulosis of large intestine without                            perforation or abscess without bleeding CPT copyright 2019 American Medical Association. All rights reserved. The codes documented in this report are preliminary and upon coder review may  be revised to meet current compliance requirements. Elon Alas. Abbey Chatters, DO [place] Abbey Chatters, DO 03/19/2020 11:09:15 AM This report has been signed electronically. Number of Addenda: 0

## 2020-03-19 NOTE — Interval H&P Note (Signed)
History and Physical Interval Note:  03/19/2020 9:14 AM  Tammy Dyer  has presented today for surgery, with the diagnosis of acute on chronic anemia, gastrointestinal bleeding, dark stools, hematochezia.  The various methods of treatment have been discussed with the patient and family. After consideration of risks, benefits and other options for treatment, the patient has consented to  Procedure(s): COLONOSCOPY WITH PROPOFOL (N/A) ESOPHAGOGASTRODUODENOSCOPY (EGD) WITH PROPOFOL (N/A) as a surgical intervention.  The patient's history has been reviewed, patient examined, no change in status, stable for surgery.  I have reviewed the patient's chart and labs.  Questions were answered to the patient's satisfaction.     Eloise Harman

## 2020-03-19 NOTE — Anesthesia Postprocedure Evaluation (Signed)
Anesthesia Post Note  Patient: Tammy Dyer  Procedure(s) Performed: COLONOSCOPY WITH PROPOFOL (N/A ) POLYPECTOMY HOT HEMOSTASIS (ARGON PLASMA COAGULATION/BICAP) ESOPHAGOGASTRODUODENOSCOPY (EGD) WITH PROPOFOL (N/A )  Patient location during evaluation: PACU Anesthesia Type: General Level of consciousness: awake Pain management: pain level controlled Vital Signs Assessment: post-procedure vital signs reviewed and stable Respiratory status: spontaneous breathing and respiratory function stable Cardiovascular status: blood pressure returned to baseline and stable Postop Assessment: no headache and no apparent nausea or vomiting Anesthetic complications: no   No complications documented.   Last Vitals:  Vitals:   03/19/20 0855 03/19/20 0952  BP: (!) 214/51 (!) 183/60  Pulse: (!) 54 66  Resp: 16 17  Temp: 36.7 C 36.8 C  SpO2: 100% 100%    Last Pain:  Vitals:   03/19/20 0952  TempSrc:   PainSc: [place]

## 2020-03-19 NOTE — Op Note (Signed)
[facility] Patient Name: Tammy Dyer Procedure Date: 03/19/2020 8:08 AM MRN: [id number] Date of Birth: Jun 16, 1933 Attending MD: Elon Alas. Abbey Chatters DO CSN: [id number] Age: 87 Admit Type: Outpatient Procedure:                Upper GI endoscopy Indications:              Hematochezia Providers:                Elon Alas. Abbey Chatters, DO, [place] Page, [place]                            Risa Grill, Technician, Aram Candela Referring MD:              Medicines:                See the Anesthesia note for documentation of the                            administered medications Complications:            No immediate complications. Estimated Blood Loss:     Estimated blood loss was minimal. Procedure:                Pre-Anesthesia Assessment:                           - The anesthesia plan was to use monitored                            anesthesia care (MAC).                           After obtaining informed consent, the endoscope was                            passed under direct vision. Throughout the                            procedure, the patient's blood pressure, pulse, and                            oxygen saturations were monitored continuously. The                            [id number]-H190 ([id number]) scope was introduced through the                            mouth, and advanced to the second part of duodenum.                            The upper GI endoscopy was accomplished without                            difficulty. The patient tolerated the procedure                            well. Scope In: 1:88:41  AM Scope Out: 9:22:31 AM Total Procedure Duration: 0 hours 4 minutes 57 seconds  Findings:      There is no endoscopic evidence of bleeding, esophagitis, ulcerations or       varices in the entire esophagus.      A medium-sized hiatal hernia was present.      Localized mild inflammation characterized by erythema was found in the       gastric antrum.      One 8 mm  pedunculated polyp with no bleeding and no stigmata of recent       bleeding was found in the gastric fundus. The polyp was removed with a       hot snare. Resection and retrieval were complete.      The duodenal bulb, first portion of the duodenum and second portion of       the duodenum were normal. Impression:               - Medium-sized hiatal hernia.                           - Gastritis.                           - One gastric polyp. Resected and retrieved.                           - Normal duodenal bulb, first portion of the                            duodenum and second portion of the duodenum. Moderate Sedation:      Per Anesthesia Care Recommendation:           - Return patient to hospital ward for ongoing care.                           - See colonoscopy report for further recs. Procedure Code(s):        --- Professional ---                           [phone], Esophagogastroduodenoscopy, flexible,                            transoral; with removal of tumor(s), polyp(s), or                            other lesion(s) by snare technique Diagnosis Code(s):        --- Professional ---                           K44.9, Diaphragmatic hernia without obstruction or                            gangrene                           K29.70, Gastritis, unspecified, without bleeding  K31.7, Polyp of stomach and duodenum                           K92.1, Melena (includes Hematochezia) CPT copyright 2019 American Medical Association. All rights reserved. The codes documented in this report are preliminary and upon coder review may  be revised to meet current compliance requirements. Elon Alas. Abbey Chatters, DO [place] Abbey Chatters, DO 03/19/2020 9:26:26 AM This report has been signed electronically. Number of Addenda: 0

## 2020-03-19 NOTE — Progress Notes (Signed)
Patient has only completed approximately half of nulytely. Endo RN made aware. Here to transport patient to procedure via wheelchair at this time.

## 2020-03-19 NOTE — Anesthesia Preprocedure Evaluation (Signed)
Anesthesia Evaluation  Patient identified by MRN, date of birth, ID band Patient awake    Reviewed: Allergy & Precautions, H&P , NPO status , Patient's Chart, lab work & pertinent test results, reviewed documented beta blocker date and time   Airway Mallampati: II  TM Distance: >3 FB Neck ROM: full    Dental no notable dental hx.    Pulmonary neg pulmonary ROS,    Pulmonary exam normal breath sounds clear to auscultation       Cardiovascular Exercise Tolerance: Good hypertension, negative cardio ROS   Rhythm:regular Rate:Normal     Neuro/Psych  Neuromuscular disease negative psych ROS   GI/Hepatic Neg liver ROS, GERD  Medicated,  Endo/Other  negative endocrine ROSdiabetes  Renal/GU ESRFRenal disease  negative genitourinary   Musculoskeletal   Abdominal   Peds  Hematology  (+) Blood dyscrasia, anemia ,   Anesthesia Other Findings   Reproductive/Obstetrics negative OB ROS                             Anesthesia Physical Anesthesia Plan  ASA: III and emergent  Anesthesia Plan: General   Post-op Pain Management:    Induction:   PONV Risk Score and Plan: Propofol infusion  Airway Management Planned:   Additional Equipment:   Intra-op Plan:   Post-operative Plan:   Informed Consent: I have reviewed the patients History and Physical, chart, labs and discussed the procedure including the risks, benefits and alternatives for the proposed anesthesia with the patient or authorized representative who has indicated his/her understanding and acceptance.     Dental Advisory Given  Plan Discussed with: CRNA  Anesthesia Plan Comments:         Anesthesia Quick Evaluation

## 2020-03-19 NOTE — Progress Notes (Signed)
PROGRESS NOTE  Tammy Dyer HYW:[id number] DOB: [birth year] DOA: 03/17/2020 PCP: Asencion Noble, MD  Brief History:  87 year old female with a history of diabetes mellitus type 2, hyperlipidemia, diverticulosis, GERD, CKD stage III presenting with hematochezia over the past month. Apparently, the patient has had some intermittent hematochezia which has gradually become more frequent. On 03/15/2020, the patient noted three bloody bowel movements. She also has been feeling somewhat dizzy and had a presyncopal episode. The next day, she did not have any further bowel movements, but on the evening of 03/16/2020, the patient had another bloody bowel movement with dizziness. She went to see her PCP on 03/17/2020. Rectal exam showed blood in the rectal vault, and the patient was directed to the emergency department for further evaluation. The patient denies any chest pain, but has noted some mild increase in dyspnea on exertion. She denies any fevers, chills, syncope, nausea, vomiting, diarrhea. She denies any NSAID use. Patient has had colonoscopy in the distant past, but cannot remember how long ago. In the emergency department, the patient was afebrile and hemodynamically stable with oxygen saturation 99% room air. BMP showed a sodium 138, potassium 4.1, serum creatinine 2.34. LFTs were unremarkable. WBC 6.6, hemoglobin 8.0, platelets 176,000. GI was consulted to assist with management.  Assessment/Plan: Hematochezia/ABLA -drop in Hgb partly dilutional and equilibration -diverticular bleed vs AVMs -GI consult appreciated -continue PPI -clear liquid diet for now>>advanced diet 2/26 -transfused 1 unit PRBC -03/19/20 colonoscopy--nonbleeding int hemorrhoids; diverticulosis in sigmoid and descending colon; 2 recent bleeding AVMs s/p APC -03/19/20 EGD--gastritis, one gastric polyp removed  Acute on chronic renal failure--CKD stage 3b -due to volume depletion in the setting of chlorthalidone  -1  unit PRBC transfused -judicious IVF  Sinuse Bradycardia -no pauses on telemetry -holding atenolol--improved  Diabetes Mellitus type 2, controlled -03/17/20 A1C--7.1 -holding amaryl and actos -novolog sliding scale for now -CBGs controlled  Hyperlipidemia -continue statin  Essential Hypertension -continue amlodipine -holding chlorthalidone due to AKI  Depression -continue sertraline     Status is: Observation  The patient will require care spanning > 2 midnights and should be moved to inpatient because: IV treatments appropriate due to intensity of illness or inability to take PO  Dispo: The patient is from: Home  Anticipated d/c is to: Home  Patient currently is not medically stable to d/c.              Difficult to place patient No        Family Communication:  daughter updated bedside 03/19/20  Consultants:  GI  Code Status:  FULL  DVT Prophylaxis:  SCDs   Procedures: As Listed in Progress Note Above  Antibiotics: None          Subjective:  No further hematochezia.  Denies f/c, cp, sob, n/v/d, abd pain Objective: Vitals:   03/19/20 0952 03/19/20 1000 03/19/20 1015 03/19/20 1254  BP: (!) 183/60 (!) 166/64 (!) 183/66 (!) 197/50  Pulse: 66 65 61 (!) 59  Resp: 17 (!) 23 11 16   Temp: 98.3 F (36.8 C)  98.2 F (36.8 C) 97.9 F (36.6 C)  TempSrc:    Oral  SpO2: 100% 100% 100% 100%  Weight:      Height:        Intake/Output Summary (Last 24 hours) at 03/19/2020 1455 Last data filed at 03/19/2020 1015 Gross per 24 hour  Intake 250 ml  Output --  Net 250 ml   Weight change:  Exam:   General:  Pt is alert, follows commands appropriately, not in acute distress  HEENT: No icterus, No thrush, No neck mass, [place]/AT  Cardiovascular: RRR, S1/S2, no rubs, no gallops  Respiratory: CTA bilaterally, no wheezing, no crackles, no rhonchi  Abdomen: Soft/+BS, non tender, non distended, no  guarding  Extremities: No edema, No lymphangitis, No petechiae, No rashes, no synovitis   Data Reviewed: I have personally reviewed following labs and imaging studies Basic Metabolic Panel: Recent Labs  Lab 03/17/20 1528 03/18/20 0409  NA 137 138  K 4.2 4.1  CL 108 112*  CO2 21* 20*  GLUCOSE 164* 139*  BUN 51* 42*  CREATININE 2.34* 2.03*  CALCIUM 8.8* 8.1*   Liver Function Tests: Recent Labs  Lab 03/17/20 1528  AST 15  ALT 13  ALKPHOS 58  BILITOT 0.4  PROT 6.4*  ALBUMIN 3.6   No results for input(s): LIPASE, AMYLASE in the last 168 hours. No results for input(s): AMMONIA in the last 168 hours. Coagulation Profile: Recent Labs  Lab 03/17/20 1528  INR 1.1   CBC: Recent Labs  Lab 03/17/20 1528 03/17/20 2009 03/18/20 0105 03/18/20 0409 03/18/20 1154 03/18/20 1808 03/19/20 0004 03/19/20 0544  WBC 6.6   < > 7.0  --  7.1 9.5 7.5 6.8  NEUTROABS 4.7  --   --   --   --   --   --   --   HGB 8.0*   < > 6.5* 6.8* 10.5* 10.0* 9.7* 8.9*  HCT 25.0*   < > 20.2* 21.2* 33.0* 31.2* 29.9* 27.9*  MCV 94.7   < > 94.4  --  94.0 93.7 93.7 93.6  PLT 176   < > 150  --  148* 180 145* 137*   < > = values in this interval not displayed.   Cardiac Enzymes: No results for input(s): CKTOTAL, CKMB, CKMBINDEX, TROPONINI in the last 168 hours. BNP: Invalid input(s): POCBNP CBG: Recent Labs  Lab 03/18/20 1625 03/19/20 0102 03/19/20 0645 03/19/20 0743 03/19/20 1214  GLUCAP 242* 150* 101* 105* 149*   HbA1C: Recent Labs    03/17/20 1520  HGBA1C 7.1*   Urine analysis:    Component Value Date/Time   COLORURINE AMBER (A) 02/07/2016 1600   APPEARANCEUR CLEAR 02/07/2016 1600   LABSPEC 1.011 02/07/2016 1600   PHURINE 5.0 02/07/2016 1600   GLUCOSEU NEGATIVE 02/07/2016 1600   HGBUR MODERATE (A) 02/07/2016 1600   BILIRUBINUR NEGATIVE 02/07/2016 1600   KETONESUR NEGATIVE 02/07/2016 1600   PROTEINUR 30 (A) 02/07/2016 1600   NITRITE POSITIVE (A) 02/07/2016 1600   LEUKOCYTESUR  SMALL (A) 02/07/2016 1600   Sepsis Labs: @LABRCNTIP (procalcitonin:4,lacticidven:4) ) Recent Results (from the past 240 hour(s))  SARS CORONAVIRUS 2 ([place] 6-24 HRS) Nasopharyngeal Nasopharyngeal Swab     Status: None   Collection Time: 03/17/20  3:58 PM   Specimen: Nasopharyngeal Swab  Result Value Ref Range Status   SARS Coronavirus 2 NEGATIVE NEGATIVE Final    Comment: (NOTE) SARS-CoV-2 target nucleic acids are NOT DETECTED.  The SARS-CoV-2 RNA is generally detectable in upper and lower respiratory specimens during the acute phase of infection. Negative results do not preclude SARS-CoV-2 infection, do not rule out co-infections with other pathogens, and should not be used as the sole basis for treatment or other patient management decisions. Negative results must be combined with clinical observations, patient history, and epidemiological information. The expected result is Negative.  Fact Sheet for Patients: SugarRoll.be  Fact Sheet for Healthcare Providers: https://www.woods-mathews.com/  This test is not yet approved or cleared by the [place] and  has been authorized for detection and/or diagnosis of SARS-CoV-2 by FDA under an Emergency Use Authorization (EUA). This EUA will remain  in effect (meaning this test can be used) for the duration of the COVID-19 declaration under Se ction 564(b)(1) of the Act, 21 U.S.C. section 360bbb-3(b)(1), unless the authorization is terminated or revoked sooner.  Performed at [facility], [place] [address], [place], [place] [address]      Scheduled Meds: . amLODipine  10 mg Oral Daily  . atorvastatin  20 mg Oral QPM  . brimonidine  1 drop Both Eyes BID  . insulin aspart  0-9 Units Subcutaneous Q6H  . pantoprazole (PROTONIX) IV  40 mg Intravenous Q24H  . sertraline  100 mg Oral Daily   Continuous Infusions:  Procedures/Studies: No results found.  Orson Eva, DO  [facility]  If 7PM-7AM, please contact night-coverage [email or web address] Password [email or web address] 03/19/2020, 2:55 PM   LOS: 1 day

## 2020-03-20 ENCOUNTER — Telehealth: Payer: Self-pay | Admitting: Internal Medicine

## 2020-03-20 DIAGNOSIS — N179 Acute kidney failure, unspecified: Secondary | ICD-10-CM

## 2020-03-20 DIAGNOSIS — K921 Melena: Principal | ICD-10-CM

## 2020-03-20 LAB — CBC
HCT: 28.2 % — ABNORMAL LOW (ref 36.0–46.0)
Hemoglobin: 9.3 g/dL — ABNORMAL LOW (ref 12.0–15.0)
MCH: 30.6 pg (ref 26.0–34.0)
MCHC: 33 g/dL (ref 30.0–36.0)
MCV: 92.8 fL (ref 80.0–100.0)
Platelets: 145 10*3/uL — ABNORMAL LOW (ref 150–400)
RBC: 3.04 MIL/uL — ABNORMAL LOW (ref 3.87–5.11)
RDW: 13.6 % (ref 11.5–15.5)
WBC: 6.2 10*3/uL (ref 4.0–10.5)
nRBC: 0 % (ref 0.0–0.2)

## 2020-03-20 LAB — BASIC METABOLIC PANEL
Anion gap: 9 (ref 5–15)
BUN: 24 mg/dL — ABNORMAL HIGH (ref 8–23)
CO2: 19 mmol/L — ABNORMAL LOW (ref 22–32)
Calcium: 8.6 mg/dL — ABNORMAL LOW (ref 8.9–10.3)
Chloride: 110 mmol/L (ref 98–111)
Creatinine, Ser: 1.72 mg/dL — ABNORMAL HIGH (ref 0.44–1.00)
GFR, Estimated: 29 mL/min — ABNORMAL LOW (ref >60)
Glucose, Bld: 136 mg/dL — ABNORMAL HIGH (ref 70–99)
Potassium: 3.4 mmol/L — ABNORMAL LOW (ref 3.5–5.1)
Sodium: 138 mmol/L (ref 135–145)

## 2020-03-20 LAB — GLUCOSE, CAPILLARY: Glucose-Capillary: 121 mg/dL — ABNORMAL HIGH (ref 70–99)

## 2020-03-20 MED ORDER — AMLODIPINE BESYLATE 10 MG PO TABS: 10.0000 mg | ORAL_TABLET | Freq: Every day | ORAL | 1 refills | Status: DC

## 2020-03-20 NOTE — Telephone Encounter (Signed)
Can we please set this patient up for hospital follow-up visit with either Dr. Gala Romney or one of the APPs?  Thank you

## 2020-03-20 NOTE — Progress Notes (Signed)
Subjective: Patient doing well today. No melena or hematochexia, hgb stable. Tolerating full diet.   Objective: Vital signs in last 24 hours: Temp:  [98.2 F (36.8 C)-98.6 F (37 C)] 98.2 F (36.8 C) (02/27 0954) Pulse Rate:  [65-75] 75 (02/27 0954) Resp:  [16-18] 18 (02/27 0954) BP: (157-167)/(50-51) 157/51 (02/27 0954) SpO2:  [97 %-100 %] 97 % (02/27 0954) Last BM Date: 03/19/20 General:   Alert and oriented, pleasant Head:  Normocephalic and atraumatic. Eyes:  No icterus, sclera clear. Conjuctiva pink.  Mouth:  Without lesions, mucosa pink and moist.  Neck:  Supple, without thyromegaly or masses.  Abdomen:  Bowel sounds present, soft, non-tender, non-distended. No HSM or hernias noted. No rebound or guarding. No masses appreciated  Msk:  Symmetrical without gross deformities. Normal posture. Pulses:  Normal pulses noted. Extremities:  Without clubbing or edema. Neurologic:  Alert and  oriented x4;  grossly normal neurologically. Skin:  Warm and dry, intact without significant lesions.  Cervical Nodes:  No significant cervical adenopathy. Psych:  Alert and cooperative. Normal mood and affect.  Intake/Output from previous day: 02/26 0701 - 02/27 0700 In: 250 [I.V.:250] Out: -  Intake/Output this shift: No intake/output data recorded.  Lab Results: Recent Labs    03/19/20 0004 03/19/20 0544 03/20/20 0552  WBC 7.5 6.8 6.2  HGB 9.7* 8.9* 9.3*  HCT 29.9* 27.9* 28.2*  PLT 145* 137* 145*   BMET Recent Labs    03/17/20 1528 03/18/20 0409 03/20/20 0552  NA 137 138 138  K 4.2 4.1 3.4*  CL 108 112* 110  CO2 21* 20* 19*  GLUCOSE 164* 139* 136*  BUN 51* 42* 24*  CREATININE 2.34* 2.03* 1.72*  CALCIUM 8.8* 8.1* 8.6*   LFT Recent Labs    03/17/20 1528  PROT 6.4*  ALBUMIN 3.6  AST 15  ALT 13  ALKPHOS 58  BILITOT 0.4   PT/INR Recent Labs    03/17/20 1528  LABPROT 14.0  INR 1.1   Hepatitis Panel No results for input(s): HEPBSAG, HCVAB, HEPAIGM, HEPBIGM  in the last 72 hours.   Studies/Results: No results found.  Assessment: *Acute lower GI bleed-etiology likely diverticular versus AVM *Acute blood loss anemia due to above *Acute on chronic renal failure due to above  Plan: Patient stable today, tolerating full diet.  Hemoglobin is stable.  Etiology of her GI bleeding likely diverticular versus AVMs, see colonoscopy report for further details.  We will arrange for outpatient follow-up with our clinic.  Discussed with patient and daughter that this may happen again in the future.  Discussed warning signs.  I have recommended that she start taking over-the-counter Benefiber or Metamucil.  I have also recommended she drink at least 4 to 6 glasses of water daily GI to sign off, please call with any questions concerns  Elon Alas. Abbey Chatters, D.O. Gastroenterology and Hepatology [facility]   LOS: 2 days    03/20/2020, 3:18 PM

## 2020-03-20 NOTE — Discharge Summary (Signed)
Physician Discharge Summary  Tammy Dyer ZOX:[id number] DOB: [birth year] DOA: 03/17/2020  PCP: Asencion Noble, MD  Admit date: 03/17/2020 Discharge date: 03/20/2020  Admitted From:Home Disposition:  Home  Recommendations for Outpatient Follow-up:  1. Follow up with PCP in 1-2 weeks 2. Please obtain BMP/CBC in one week 3. Please follow up on the following pending results:    Discharge Condition: Stable CODE STATUS: FULL Diet recommendation: Heart Healthy   Brief/Interim Summary: 87 year old female with a history of diabetes mellitus type 2, hyperlipidemia, diverticulosis, GERD, CKD stage III presenting with hematochezia over the past month. Apparently, the patient has had some intermittent hematochezia which has gradually become more frequent. On 03/15/2020, the patient noted three bloody bowel movements. She also has been feeling somewhat dizzy and had a presyncopal episode. The next day, she did not have any further bowel movements, but on the evening of 03/16/2020, the patient had another bloody bowel movement with dizziness. She went to see her PCP on 03/17/2020. Rectal exam showed blood in the rectal vault, and the patient was directed to the emergency department for further evaluation. The patient denies any chest pain, but has noted some mild increase in dyspnea on exertion. She denies any fevers, chills, syncope, nausea, vomiting, diarrhea. She denies any NSAID use. Patient has had colonoscopy in the distant past, but cannot remember how long ago. In the emergency department, the patient was afebrile and hemodynamically stable with oxygen saturation 99% room air. BMP showed a sodium 138, potassium 4.1, serum creatinine 2.34. LFTs were unremarkable. WBC 6.6, hemoglobin 8.0, platelets 176,000. GI was consulted to assist with management.  Discharge Diagnoses:  Hematochezia/ABLA -drop in Hgb partly dilutional and equilibration -diverticular bleed vs AVMs -GI consult  appreciated -continue PPI -clear liquid diet for now>>advanced diet 2/26 which pt tolerated -transfused 1 unit PRBC>>Hgb remained stable thereafter without any further hematochezia -03/19/20 colonoscopy--nonbleeding int hemorrhoids; diverticulosis in sigmoid and descending colon; 2 recent bleeding AVMs s/p APC -03/19/20 EGD--gastritis, one gastric polyp removed  Acute on chronic renal failure--CKD stage 3b -due to volume depletion in the setting of chlorthalidone  -1 unit PRBC transfused -baseline Hgb 1.7-1.8 -Serum creatinine  Peaked 2.34 -judicious IVF -serum creatinine 1.72 on day of d/c  Sinuse Bradycardia -no pauses on telemetry -holding atenolol--improved--will not restart  Diabetes Mellitus type 2, controlled -03/17/20 A1C--7.1 -holding amaryl and actos -novolog sliding scale for now -CBGs controlled  Hyperlipidemia -continue statin  Essential Hypertension -continue amlodipine--increased to 10 mg daily -holding chlorthalidone due to AKI>.restart after d/c  Depression -continue sertraline   Discharge Instructions   Allergies as of 03/20/2020      Reactions   Codeine    REACTION: head ache   Sulfonamide Derivatives    REACTION: yeast infection      Medication List    STOP taking these medications   atenolol 25 MG tablet Commonly known as: TENORMIN   benzonatate 200 MG capsule Commonly known as: TESSALON   pioglitazone 30 MG tablet Commonly known as: ACTOS     TAKE these medications   acetaminophen 500 MG tablet Commonly known as: TYLENOL Take 1,000 mg by mouth every 6 (six) hours as needed for mild pain or moderate pain.   amLODipine 10 MG tablet Commonly known as: NORVASC Take 1 tablet (10 mg total) by mouth daily. Start taking on: March 21, 2020 What changed:   medication strength  how much to take   aspirin 81 MG tablet Take 81 mg by mouth daily.   atorvastatin 20 MG  tablet Commonly known as: LIPITOR Take 20 mg by mouth  every evening.   Blue-Emu Super Strength Crea Apply 1 application topically daily as needed (to arm(s) for pain).   brimonidine 0.1 % Soln Commonly known as: ALPHAGAN P Place 1 drop into both eyes in the morning and at bedtime.   chlorthalidone 25 MG tablet Commonly known as: HYGROTON Take 25 mg by mouth daily.   glimepiride 2 MG tablet Commonly known as: AMARYL Take 2 mg by mouth daily before breakfast.   Lantus SoloStar 100 UNIT/ML Solostar Pen Generic drug: insulin glargine Inject 10 Units into the skin every evening.   Melatonin 10 MG Tabs Take 10 mg by mouth at bedtime.   omeprazole 20 MG capsule Commonly known as: PriLOSEC Take 1 capsule (20 mg total) by mouth daily.   sertraline 100 MG tablet Commonly known as: ZOLOFT Take 100 mg by mouth daily.   trimethoprim 100 MG tablet Commonly known as: TRIMPEX Take 100 mg by mouth daily.       Allergies  Allergen Reactions  . Codeine     REACTION: head ache  . Sulfonamide Derivatives     REACTION: yeast infection    Consultations:  GI   Procedures/Studies:  No results found.      Discharge Exam: Vitals:   03/19/20 2148 03/20/20 0954  BP: (!) 167/50 (!) 157/51  Pulse: 65 75  Resp: 16 18  Temp: 98.6 F (37 C) 98.2 F (36.8 C)  SpO2: 100% 97%   Vitals:   03/19/20 1015 03/19/20 1254 03/19/20 2148 03/20/20 0954  BP: (!) 183/66 (!) 197/50 (!) 167/50 (!) 157/51  Pulse: 61 (!) 59 65 75  Resp: 11 16 16 18   Temp: 98.2 F (36.8 C) 97.9 F (36.6 C) 98.6 F (37 C) 98.2 F (36.8 C)  TempSrc:  Oral Oral Oral  SpO2: 100% 100% 100% 97%  Weight:      Height:        General: Pt is alert, awake, not in acute distress Cardiovascular: RRR, S1/S2 +, no rubs, no gallops Respiratory: CTA bilaterally, no wheezing, no rhonchi Abdominal: Soft, NT, ND, bowel sounds + Extremities: no edema, no cyanosis   The results of significant diagnostics from this hospitalization (including imaging, microbiology,  ancillary and laboratory) are listed below for reference.    Significant Diagnostic Studies: No results found.   Microbiology: Recent Results (from the past 240 hour(s))  SARS CORONAVIRUS 2 (TAT 6-24 HRS) Nasopharyngeal Nasopharyngeal Swab     Status: None   Collection Time: 03/17/20  3:58 PM   Specimen: Nasopharyngeal Swab  Result Value Ref Range Status   SARS Coronavirus 2 NEGATIVE NEGATIVE Final    Comment: (NOTE) SARS-CoV-2 target nucleic acids are NOT DETECTED.  The SARS-CoV-2 RNA is generally detectable in upper and lower respiratory specimens during the acute phase of infection. Negative results do not preclude SARS-CoV-2 infection, do not rule out co-infections with other pathogens, and should not be used as the sole basis for treatment or other patient management decisions. Negative results must be combined with clinical observations, patient history, and epidemiological information. The expected result is Negative.  Fact Sheet for Patients: SugarRoll.be  Fact Sheet for Healthcare Providers: https://www.woods-mathews.com/  This test is not yet approved or cleared by the [place] FDA and  has been authorized for detection and/or diagnosis of SARS-CoV-2 by FDA under an Emergency Use Authorization (EUA). This EUA will remain  in effect (meaning this test can be used) for the duration  of the COVID-19 declaration under Se ction 564(b)(1) of the Act, 21 U.S.C. section 360bbb-3(b)(1), unless the authorization is terminated or revoked sooner.  Performed at [facility], [place] [address], [place], [place] [address]      Labs: Basic Metabolic Panel: Recent Labs  Lab 03/17/20 1528 03/18/20 0409 03/19/20 1514 03/20/20 0552  NA 137 138  --  138  K 4.2 4.1  --  3.4*  CL 108 112*  --  110  CO2 21* 20*  --  19*  GLUCOSE 164* 139*  --  136*  BUN 51* 42*  --  24*  CREATININE 2.34* 2.03*  --  1.72*  CALCIUM 8.8* 8.1*   --  8.6*  MG  --   --  1.7  --    Liver Function Tests: Recent Labs  Lab 03/17/20 1528  AST 15  ALT 13  ALKPHOS 58  BILITOT 0.4  PROT 6.4*  ALBUMIN 3.6   No results for input(s): LIPASE, AMYLASE in the last 168 hours. No results for input(s): AMMONIA in the last 168 hours. CBC: Recent Labs  Lab 03/17/20 1528 03/17/20 2009 03/18/20 1154 03/18/20 1808 03/19/20 0004 03/19/20 0544 03/20/20 0552  WBC 6.6   < > 7.1 9.5 7.5 6.8 6.2  NEUTROABS 4.7  --   --   --   --   --   --   HGB 8.0*   < > 10.5* 10.0* 9.7* 8.9* 9.3*  HCT 25.0*   < > 33.0* 31.2* 29.9* 27.9* 28.2*  MCV 94.7   < > 94.0 93.7 93.7 93.6 92.8  PLT 176   < > 148* 180 145* 137* 145*   < > = values in this interval not displayed.   Cardiac Enzymes: No results for input(s): CKTOTAL, CKMB, CKMBINDEX, TROPONINI in the last 168 hours. BNP: Invalid input(s): POCBNP CBG: Recent Labs  Lab 03/19/20 0743 03/19/20 1214 03/19/20 1621 03/19/20 2228 03/20/20 0522  GLUCAP 105* 149* 148* 166* 121*    Time coordinating discharge:  36 minutes  Signed:  Orson Eva, DO [facility] Pager: [phone] 03/20/2020, 2:13 PM

## 2020-03-21 ENCOUNTER — Encounter: Payer: Self-pay | Admitting: Internal Medicine

## 2020-03-21 LAB — TYPE AND SCREEN
Antibody Screen: NEGATIVE
Unit division: 0
Unit division: 0

## 2020-03-21 LAB — BPAM RBC
Blood Product Expiration Date: 202203232359
Blood Product Expiration Date: 202203232359
ISSUE DATE / TIME: 202202250544
Unit Type and Rh: 6200
Unit Type and Rh: 6200

## 2020-03-22 ENCOUNTER — Encounter (HOSPITAL_COMMUNITY): Payer: Self-pay | Admitting: Internal Medicine

## 2020-03-22 LAB — SURGICAL PATHOLOGY

## 2020-03-24 DIAGNOSIS — H469 Unspecified optic neuritis: Secondary | ICD-10-CM | POA: Diagnosis not present

## 2020-03-25 DIAGNOSIS — H469 Unspecified optic neuritis: Secondary | ICD-10-CM | POA: Diagnosis not present

## 2020-03-25 DIAGNOSIS — H3581 Retinal edema: Secondary | ICD-10-CM | POA: Diagnosis not present

## 2020-03-25 DIAGNOSIS — Z7984 Long term (current) use of oral hypoglycemic drugs: Secondary | ICD-10-CM | POA: Diagnosis not present

## 2020-03-25 DIAGNOSIS — H4710 Unspecified papilledema: Secondary | ICD-10-CM | POA: Diagnosis not present

## 2020-03-25 DIAGNOSIS — E11311 Type 2 diabetes mellitus with unspecified diabetic retinopathy with macular edema: Secondary | ICD-10-CM | POA: Diagnosis not present

## 2020-03-25 DIAGNOSIS — H53412 Scotoma involving central area, left eye: Secondary | ICD-10-CM | POA: Diagnosis not present

## 2020-03-25 DIAGNOSIS — E113213 Type 2 diabetes mellitus with mild nonproliferative diabetic retinopathy with macular edema, bilateral: Secondary | ICD-10-CM | POA: Diagnosis not present

## 2020-03-28 DIAGNOSIS — K625 Hemorrhage of anus and rectum: Secondary | ICD-10-CM | POA: Diagnosis not present

## 2020-03-28 DIAGNOSIS — R001 Bradycardia, unspecified: Secondary | ICD-10-CM | POA: Diagnosis not present

## 2020-03-28 DIAGNOSIS — D62 Acute posthemorrhagic anemia: Secondary | ICD-10-CM | POA: Diagnosis not present

## 2020-04-12 ENCOUNTER — Ambulatory Visit: Payer: PPO | Admitting: Internal Medicine

## 2020-04-12 DIAGNOSIS — Z79899 Other long term (current) drug therapy: Secondary | ICD-10-CM | POA: Diagnosis not present

## 2020-04-12 DIAGNOSIS — F329 Major depressive disorder, single episode, unspecified: Secondary | ICD-10-CM | POA: Diagnosis not present

## 2020-04-12 DIAGNOSIS — E1129 Type 2 diabetes mellitus with other diabetic kidney complication: Secondary | ICD-10-CM | POA: Diagnosis not present

## 2020-04-12 DIAGNOSIS — D62 Acute posthemorrhagic anemia: Secondary | ICD-10-CM | POA: Diagnosis not present

## 2020-04-12 DIAGNOSIS — N184 Chronic kidney disease, stage 4 (severe): Secondary | ICD-10-CM | POA: Diagnosis not present

## 2020-04-12 DIAGNOSIS — R001 Bradycardia, unspecified: Secondary | ICD-10-CM | POA: Diagnosis not present

## 2020-04-12 DIAGNOSIS — K922 Gastrointestinal hemorrhage, unspecified: Secondary | ICD-10-CM | POA: Diagnosis not present

## 2020-04-12 DIAGNOSIS — I10 Essential (primary) hypertension: Secondary | ICD-10-CM | POA: Diagnosis not present

## 2020-04-22 DIAGNOSIS — D62 Acute posthemorrhagic anemia: Secondary | ICD-10-CM | POA: Diagnosis not present

## 2020-04-22 DIAGNOSIS — I10 Essential (primary) hypertension: Secondary | ICD-10-CM | POA: Diagnosis not present

## 2020-04-22 DIAGNOSIS — N184 Chronic kidney disease, stage 4 (severe): Secondary | ICD-10-CM | POA: Diagnosis not present

## 2020-04-22 DIAGNOSIS — E785 Hyperlipidemia, unspecified: Secondary | ICD-10-CM | POA: Diagnosis not present

## 2020-04-22 DIAGNOSIS — E559 Vitamin D deficiency, unspecified: Secondary | ICD-10-CM | POA: Diagnosis not present

## 2020-04-26 ENCOUNTER — Ambulatory Visit: Payer: PPO | Admitting: Internal Medicine

## 2020-04-26 ENCOUNTER — Other Ambulatory Visit: Payer: Self-pay

## 2020-04-26 ENCOUNTER — Encounter: Payer: Self-pay | Admitting: Internal Medicine

## 2020-04-26 VITALS — BP 153/68 | HR 69 | Temp 97.3°F | Ht 61.0 in | Wt 141.2 lb

## 2020-04-26 DIAGNOSIS — K219 Gastro-esophageal reflux disease without esophagitis: Secondary | ICD-10-CM

## 2020-04-26 DIAGNOSIS — K922 Gastrointestinal hemorrhage, unspecified: Secondary | ICD-10-CM | POA: Diagnosis not present

## 2020-04-26 DIAGNOSIS — K5730 Diverticulosis of large intestine without perforation or abscess without bleeding: Secondary | ICD-10-CM | POA: Diagnosis not present

## 2020-04-26 NOTE — Patient Instructions (Signed)
Continue Protonix 20 mg daily  Continue to avoid aspirin and all other NSAID products like Aleve and ibuprofen  Tylenol okay  Follow-up with Dr. Willey Blade.  As long as evidence of recurrent bleeding, no further GI evaluation is warranted.  We will see you back in follow-up on as-needed basis.

## 2020-04-26 NOTE — Progress Notes (Signed)
Primary Care Physician:  Asencion Noble, MD Primary Gastroenterologist:  Dr. Gala Romney  Pre-Procedure History & Physical: HPI:  Tammy Dyer is a 87 y.o. female here for hospital follow-up.  Admitted with painless GI bleeding and a notable drop in her hemoglobin to the 8 range requiring 1 unit of packed RBCs recently. EGD and colonoscopy performed.  EGD yielded a fundal gland polyp which was removed.  2 ascending colon AVMs and colonoscopy were ablated with APC.  Also, noted significant diverticulosis felt to be an innocent finding. 81 mg aspirin daily stopped.  No other NSAIDs.  Clinically has done well without melena or hematochezia no abdominal pain GERD symptoms well controlled on Prilosec. Follow-up hemoglobin last 10 epic 9.3 about a month ago.  Patient tells me that Dr. Willey Blade obtain labs recently and her hemoglobin was back over 10.  Past Medical History:  Diagnosis Date  . CRI (chronic renal insufficiency)   . DM (diabetes mellitus) ([place])   . GERD (gastroesophageal reflux disease)   . HTN (hypertension)   . Hypercholesterolemia     Past Surgical History:  Procedure Laterality Date  . ABDOMINAL HYSTERECTOMY    . CATARACT EXTRACTION    . CHOLECYSTECTOMY    . COLONOSCOPY  2005   [facility]: diverticulosis, hemorrhoids, screening 2015  . COLONOSCOPY WITH PROPOFOL N/A 03/19/2020   Procedure: COLONOSCOPY WITH PROPOFOL;  Surgeon: Eloise Harman, DO;  [facility];  Service: Endoscopy;  Laterality: N/A;  . ESOPHAGOGASTRODUODENOSCOPY  10/22/06   normal esophagus/small hiatal hernia  . ESOPHAGOGASTRODUODENOSCOPY (EGD) WITH PROPOFOL N/A 03/19/2020   Procedure: ESOPHAGOGASTRODUODENOSCOPY (EGD) WITH PROPOFOL;  Surgeon: Eloise Harman, DO;  [facility];  Service: Endoscopy;  Laterality: N/A;  . HOT HEMOSTASIS  03/19/2020   Procedure: HOT HEMOSTASIS (ARGON PLASMA COAGULATION/BICAP);  Surgeon: Eloise Harman, DO;  [facility];  Service: Endoscopy;;  .  POLYPECTOMY  03/19/2020   Procedure: POLYPECTOMY;  Surgeon: Eloise Harman, DO;  [facility];  Service: Endoscopy;;  gastric polyp    Prior to Admission medications   Medication Sig Start Date End Date Taking? Authorizing Provider  acetaminophen (TYLENOL) 500 MG tablet Take 1,000 mg by mouth every 6 (six) hours as needed for mild pain or moderate pain.   Yes [provider]  amLODipine (NORVASC) 10 MG tablet Take 1 tablet (10 mg total) by mouth daily. Patient taking differently: Take 5 mg by mouth daily. 03/21/20  Yes Tat, Shanon Brow, MD  atorvastatin (LIPITOR) 20 MG tablet Take 20 mg by mouth every evening. 03/24/11  Yes [provider]  chlorthalidone (HYGROTON) 25 MG tablet Take 25 mg by mouth daily.   Yes [provider]  Ergocalciferol (VITAMIN D2) 50 MCG (2000 UT) TABS Take 1 tablet by mouth daily.   Yes [provider]  glimepiride (AMARYL) 2 MG tablet Take 2 mg by mouth daily before breakfast. 03/12/11  Yes [provider]  hydrALAZINE (APRESOLINE) 10 MG tablet Take 10 mg by mouth 3 (three) times daily. 04/22/20  Yes [provider]  LANTUS SOLOSTAR 100 UNIT/ML Solostar Pen Inject 10 Units into the skin every evening. 11/20/19  Yes [provider]  Liniments (BLUE-EMU SUPER STRENGTH) CREA Apply 1 application topically daily as needed (to arm(s) for pain).   Yes [provider]  Melatonin 10 MG TABS Take 10 mg by mouth at bedtime.   Yes [provider]  omeprazole (PRILOSEC) 20 MG capsule Take 1 capsule (20 mg  total) by mouth daily. 04/19/15  Yes Ferne Ellingwood, Cristopher Estimable, MD  sertraline (ZOLOFT) 100 MG tablet Take 100 mg by mouth daily. 03/31/11  Yes [provider]  trimethoprim (TRIMPEX) 100 MG tablet Take 100 mg by mouth daily.  03/27/14  Yes [provider]    Allergies as of 04/26/2020 - Review Complete 04/26/2020  Allergen Reaction Noted  . Codeine  03/15/2010  . Sulfonamide derivatives   03/15/2010    Family History  Problem Relation Age of Onset  . Colon cancer Neg Hx     Social History   Socioeconomic History  . Marital status: Married    Spouse name: Not on file  . Number of children: Not on file  . Years of education: Not on file  . Highest education level: Not on file  Occupational History  . Not on file  Tobacco Use  . Smoking status: Never Smoker  . Smokeless tobacco: Never Used  Vaping Use  . Vaping Use: Never used  Substance and Sexual Activity  . Alcohol use: No  . Drug use: No  . Sexual activity: Not on file  Other Topics Concern  . Not on file  Social History Narrative  . Not on file   Social Determinants of Health   Financial Resource Strain: Not on file  Food Insecurity: Not on file  Transportation Needs: Not on file  Physical Activity: Not on file  Stress: Not on file  Social Connections: Not on file  Intimate Partner Violence: Not on file    Review of Systems: See HPI, otherwise negative ROS  Physical Exam: BP (!) 153/68   Pulse 69   Temp (!) 97.3 F (36.3 C) (Temporal)   Ht 5\' 1"  (1.549 m)   Wt 141 lb 3.2 oz (64 kg)   BMI 26.68 kg/m  General:   Alert,   pleasant and cooperative in NAD -accompanied by daughter,  Pam Neck:  Supple; no masses or thyromegaly. No significant cervical adenopathy. Lungs:  Clear throughout to auscultation.   No wheezes, crackles, or rhonchi. No acute distress. Heart:  Regular rate and rhythm; no murmurs, clicks, rubs,  or gallops. Abdomen: Non-distended, normal bowel sounds.  Soft and nontender without appreciable mass or hepatosplenomegaly.  Pulses:  Normal pulses noted. Extremities:  Without clubbing or edema.  Impression/Plan: Pleasant 87 year old lady presenting with a significant GI bleed recently.  Ascending colon AVMs treated.  Significant, but innocent appearing, diverticular disease also noted.  Fundal gland polyp removed on EGD.  GERD well controlled on Prilosec 20 mg daily  At  this point, etiology of bleeding more likely either AVMs or less likely diverticula.  I suppose small bowel source not absolutely excluded at this time.  Clinically, she is doing very well at this point in time.  Recommendations:  Continue Protonix 20 mg daily  Continue to avoid aspirin and all other NSAID products like Aleve and ibuprofen  Tylenol okay  Follow-up with Dr. Willey Blade.  As long as evidence of recurrent bleeding, no further GI evaluation is warranted.  We will see you back in follow-up on as-needed basis.    Notice: This dictation was prepared with Dragon dictation along with smaller phrase technology. Any transcriptional errors that result from this process are unintentional and may not be corrected upon review.

## 2020-05-24 DIAGNOSIS — I10 Essential (primary) hypertension: Secondary | ICD-10-CM | POA: Diagnosis not present

## 2020-06-03 DIAGNOSIS — H53412 Scotoma involving central area, left eye: Secondary | ICD-10-CM | POA: Diagnosis not present

## 2020-06-03 DIAGNOSIS — E113299 Type 2 diabetes mellitus with mild nonproliferative diabetic retinopathy without macular edema, unspecified eye: Secondary | ICD-10-CM | POA: Diagnosis not present

## 2020-06-03 DIAGNOSIS — H4710 Unspecified papilledema: Secondary | ICD-10-CM | POA: Diagnosis not present

## 2020-06-03 DIAGNOSIS — E1139 Type 2 diabetes mellitus with other diabetic ophthalmic complication: Secondary | ICD-10-CM | POA: Diagnosis not present

## 2020-06-03 DIAGNOSIS — E11319 Type 2 diabetes mellitus with unspecified diabetic retinopathy without macular edema: Secondary | ICD-10-CM | POA: Diagnosis not present

## 2020-06-03 DIAGNOSIS — H47099 Other disorders of optic nerve, not elsewhere classified, unspecified eye: Secondary | ICD-10-CM | POA: Diagnosis not present

## 2020-06-03 DIAGNOSIS — Z7984 Long term (current) use of oral hypoglycemic drugs: Secondary | ICD-10-CM | POA: Diagnosis not present

## 2020-06-28 DIAGNOSIS — I10 Essential (primary) hypertension: Secondary | ICD-10-CM | POA: Diagnosis not present

## 2020-08-22 DIAGNOSIS — E785 Hyperlipidemia, unspecified: Secondary | ICD-10-CM | POA: Diagnosis not present

## 2020-08-22 DIAGNOSIS — N184 Chronic kidney disease, stage 4 (severe): Secondary | ICD-10-CM | POA: Diagnosis not present

## 2020-08-22 DIAGNOSIS — R809 Proteinuria, unspecified: Secondary | ICD-10-CM | POA: Diagnosis not present

## 2020-08-22 DIAGNOSIS — Z79899 Other long term (current) drug therapy: Secondary | ICD-10-CM | POA: Diagnosis not present

## 2020-08-22 DIAGNOSIS — I10 Essential (primary) hypertension: Secondary | ICD-10-CM | POA: Diagnosis not present

## 2020-09-02 DIAGNOSIS — E1129 Type 2 diabetes mellitus with other diabetic kidney complication: Secondary | ICD-10-CM | POA: Diagnosis not present

## 2020-09-02 DIAGNOSIS — R7309 Other abnormal glucose: Secondary | ICD-10-CM | POA: Diagnosis not present

## 2020-09-02 DIAGNOSIS — N184 Chronic kidney disease, stage 4 (severe): Secondary | ICD-10-CM | POA: Diagnosis not present

## 2020-09-02 DIAGNOSIS — I10 Essential (primary) hypertension: Secondary | ICD-10-CM | POA: Diagnosis not present

## 2020-09-05 ENCOUNTER — Encounter (INDEPENDENT_AMBULATORY_CARE_PROVIDER_SITE_OTHER): Payer: PPO | Admitting: Ophthalmology

## 2020-09-05 ENCOUNTER — Other Ambulatory Visit: Payer: Self-pay

## 2020-09-05 DIAGNOSIS — H353132 Nonexudative age-related macular degeneration, bilateral, intermediate dry stage: Secondary | ICD-10-CM

## 2020-09-05 DIAGNOSIS — E113293 Type 2 diabetes mellitus with mild nonproliferative diabetic retinopathy without macular edema, bilateral: Secondary | ICD-10-CM

## 2020-09-05 DIAGNOSIS — H47212 Primary optic atrophy, left eye: Secondary | ICD-10-CM

## 2020-09-05 DIAGNOSIS — I10 Essential (primary) hypertension: Secondary | ICD-10-CM | POA: Diagnosis not present

## 2020-09-05 DIAGNOSIS — H43813 Vitreous degeneration, bilateral: Secondary | ICD-10-CM

## 2020-09-05 DIAGNOSIS — H35033 Hypertensive retinopathy, bilateral: Secondary | ICD-10-CM

## 2020-10-25 DIAGNOSIS — Z23 Encounter for immunization: Secondary | ICD-10-CM | POA: Diagnosis not present

## 2020-12-12 DIAGNOSIS — Z79899 Other long term (current) drug therapy: Secondary | ICD-10-CM | POA: Diagnosis not present

## 2020-12-12 DIAGNOSIS — E1129 Type 2 diabetes mellitus with other diabetic kidney complication: Secondary | ICD-10-CM | POA: Diagnosis not present

## 2020-12-12 DIAGNOSIS — N184 Chronic kidney disease, stage 4 (severe): Secondary | ICD-10-CM | POA: Diagnosis not present

## 2020-12-12 DIAGNOSIS — D631 Anemia in chronic kidney disease: Secondary | ICD-10-CM | POA: Diagnosis not present

## 2020-12-23 DIAGNOSIS — E1122 Type 2 diabetes mellitus with diabetic chronic kidney disease: Secondary | ICD-10-CM | POA: Diagnosis not present

## 2020-12-23 DIAGNOSIS — D631 Anemia in chronic kidney disease: Secondary | ICD-10-CM | POA: Diagnosis not present

## 2020-12-23 DIAGNOSIS — R7309 Other abnormal glucose: Secondary | ICD-10-CM | POA: Diagnosis not present

## 2020-12-23 DIAGNOSIS — N184 Chronic kidney disease, stage 4 (severe): Secondary | ICD-10-CM | POA: Diagnosis not present

## 2021-03-24 DIAGNOSIS — I10 Essential (primary) hypertension: Secondary | ICD-10-CM | POA: Diagnosis not present

## 2021-03-24 DIAGNOSIS — N184 Chronic kidney disease, stage 4 (severe): Secondary | ICD-10-CM | POA: Diagnosis not present

## 2021-03-24 DIAGNOSIS — Z79899 Other long term (current) drug therapy: Secondary | ICD-10-CM | POA: Diagnosis not present

## 2021-03-24 DIAGNOSIS — E1129 Type 2 diabetes mellitus with other diabetic kidney complication: Secondary | ICD-10-CM | POA: Diagnosis not present

## 2021-04-20 DIAGNOSIS — R8291 Other chromoabnormalities of urine: Secondary | ICD-10-CM | POA: Diagnosis not present

## 2021-04-20 DIAGNOSIS — E1122 Type 2 diabetes mellitus with diabetic chronic kidney disease: Secondary | ICD-10-CM | POA: Diagnosis not present

## 2021-04-20 DIAGNOSIS — N184 Chronic kidney disease, stage 4 (severe): Secondary | ICD-10-CM | POA: Diagnosis not present

## 2021-04-20 DIAGNOSIS — Z01812 Encounter for preprocedural laboratory examination: Secondary | ICD-10-CM | POA: Diagnosis not present

## 2021-04-20 DIAGNOSIS — I10 Essential (primary) hypertension: Secondary | ICD-10-CM | POA: Diagnosis not present

## 2021-04-20 DIAGNOSIS — E1129 Type 2 diabetes mellitus with other diabetic kidney complication: Secondary | ICD-10-CM | POA: Diagnosis not present

## 2021-07-24 DIAGNOSIS — Z79899 Other long term (current) drug therapy: Secondary | ICD-10-CM | POA: Diagnosis not present

## 2021-07-24 DIAGNOSIS — I10 Essential (primary) hypertension: Secondary | ICD-10-CM | POA: Diagnosis not present

## 2021-07-24 DIAGNOSIS — E1129 Type 2 diabetes mellitus with other diabetic kidney complication: Secondary | ICD-10-CM | POA: Diagnosis not present

## 2021-07-24 DIAGNOSIS — N184 Chronic kidney disease, stage 4 (severe): Secondary | ICD-10-CM | POA: Diagnosis not present

## 2021-07-27 DIAGNOSIS — I10 Essential (primary) hypertension: Secondary | ICD-10-CM | POA: Diagnosis not present

## 2021-07-27 DIAGNOSIS — N184 Chronic kidney disease, stage 4 (severe): Secondary | ICD-10-CM | POA: Diagnosis not present

## 2021-07-27 DIAGNOSIS — R7309 Other abnormal glucose: Secondary | ICD-10-CM | POA: Diagnosis not present

## 2021-07-27 DIAGNOSIS — E1122 Type 2 diabetes mellitus with diabetic chronic kidney disease: Secondary | ICD-10-CM | POA: Diagnosis not present

## 2021-09-04 ENCOUNTER — Encounter (INDEPENDENT_AMBULATORY_CARE_PROVIDER_SITE_OTHER): Payer: HMO | Admitting: Ophthalmology

## 2021-09-04 DIAGNOSIS — H43813 Vitreous degeneration, bilateral: Secondary | ICD-10-CM

## 2021-09-04 DIAGNOSIS — H35033 Hypertensive retinopathy, bilateral: Secondary | ICD-10-CM | POA: Diagnosis not present

## 2021-09-04 DIAGNOSIS — I10 Essential (primary) hypertension: Secondary | ICD-10-CM | POA: Diagnosis not present

## 2021-09-04 DIAGNOSIS — E113293 Type 2 diabetes mellitus with mild nonproliferative diabetic retinopathy without macular edema, bilateral: Secondary | ICD-10-CM

## 2021-09-04 DIAGNOSIS — H353132 Nonexudative age-related macular degeneration, bilateral, intermediate dry stage: Secondary | ICD-10-CM | POA: Diagnosis not present

## 2021-10-09 ENCOUNTER — Ambulatory Visit (HOSPITAL_COMMUNITY)
Admission: RE | Admit: 2021-10-09 | Discharge: 2021-10-09 | Disposition: A | Discharge status: Home / Self Care | Payer: HMO | Source: Ambulatory Visit | Attending: Internal Medicine | Admitting: Internal Medicine

## 2021-10-09 ENCOUNTER — Other Ambulatory Visit (HOSPITAL_COMMUNITY): Payer: Self-pay | Admitting: Internal Medicine

## 2021-10-09 DIAGNOSIS — M545 Low back pain, unspecified: Secondary | ICD-10-CM | POA: Diagnosis not present

## 2021-10-09 DIAGNOSIS — M5416 Radiculopathy, lumbar region: Secondary | ICD-10-CM | POA: Insufficient documentation

## 2021-10-09 DIAGNOSIS — M4316 Spondylolisthesis, lumbar region: Secondary | ICD-10-CM | POA: Diagnosis not present

## 2021-10-09 DIAGNOSIS — M5136 Other intervertebral disc degeneration, lumbar region: Secondary | ICD-10-CM | POA: Diagnosis not present

## 2021-10-24 DIAGNOSIS — I10 Essential (primary) hypertension: Secondary | ICD-10-CM | POA: Diagnosis not present

## 2021-10-24 DIAGNOSIS — E1129 Type 2 diabetes mellitus with other diabetic kidney complication: Secondary | ICD-10-CM | POA: Diagnosis not present

## 2021-10-24 DIAGNOSIS — Z79899 Other long term (current) drug therapy: Secondary | ICD-10-CM | POA: Diagnosis not present

## 2021-10-24 DIAGNOSIS — N184 Chronic kidney disease, stage 4 (severe): Secondary | ICD-10-CM | POA: Diagnosis not present

## 2021-10-26 DIAGNOSIS — N184 Chronic kidney disease, stage 4 (severe): Secondary | ICD-10-CM | POA: Diagnosis not present

## 2021-10-26 DIAGNOSIS — I10 Essential (primary) hypertension: Secondary | ICD-10-CM | POA: Diagnosis not present

## 2021-10-26 DIAGNOSIS — Z23 Encounter for immunization: Secondary | ICD-10-CM | POA: Diagnosis not present

## 2021-10-26 DIAGNOSIS — M5416 Radiculopathy, lumbar region: Secondary | ICD-10-CM | POA: Diagnosis not present

## 2021-10-26 DIAGNOSIS — I700 Atherosclerosis of aorta: Secondary | ICD-10-CM | POA: Diagnosis not present

## 2021-10-26 DIAGNOSIS — E1122 Type 2 diabetes mellitus with diabetic chronic kidney disease: Secondary | ICD-10-CM | POA: Diagnosis not present

## 2021-10-26 DIAGNOSIS — R7309 Other abnormal glucose: Secondary | ICD-10-CM | POA: Diagnosis not present

## 2021-10-27 ENCOUNTER — Other Ambulatory Visit: Payer: Self-pay | Admitting: Internal Medicine

## 2021-10-27 DIAGNOSIS — M5416 Radiculopathy, lumbar region: Secondary | ICD-10-CM

## 2021-11-18 ENCOUNTER — Other Ambulatory Visit: Payer: HMO

## 2021-11-19 ENCOUNTER — Ambulatory Visit
Admission: RE | Admit: 2021-11-19 | Discharge: 2021-11-19 | Disposition: A | Discharge status: Home / Self Care | Payer: HMO | Source: Ambulatory Visit | Attending: Internal Medicine | Admitting: Internal Medicine

## 2021-11-19 DIAGNOSIS — M5416 Radiculopathy, lumbar region: Secondary | ICD-10-CM

## 2022-01-11 ENCOUNTER — Other Ambulatory Visit: Payer: Self-pay | Admitting: Neurosurgery

## 2022-01-31 ENCOUNTER — Other Ambulatory Visit: Payer: Self-pay | Admitting: Neurosurgery

## 2022-02-12 ENCOUNTER — Ambulatory Visit: Admit: 2022-02-12 | Payer: HMO | Admitting: Neurosurgery

## 2022-02-12 SURGERY — LUMBAR LAMINECTOMY/DECOMPRESSION MICRODISCECTOMY 2 LEVELS
Anesthesia: General | Laterality: Left

## 2022-08-01 DIAGNOSIS — M5186 Other intervertebral disc disorders, lumbar region: Secondary | ICD-10-CM | POA: Diagnosis not present

## 2022-08-01 DIAGNOSIS — Z79899 Other long term (current) drug therapy: Secondary | ICD-10-CM | POA: Diagnosis not present

## 2022-08-01 DIAGNOSIS — E1129 Type 2 diabetes mellitus with other diabetic kidney complication: Secondary | ICD-10-CM | POA: Diagnosis not present

## 2022-08-01 DIAGNOSIS — N184 Chronic kidney disease, stage 4 (severe): Secondary | ICD-10-CM | POA: Diagnosis not present

## 2022-08-01 DIAGNOSIS — I10 Essential (primary) hypertension: Secondary | ICD-10-CM | POA: Diagnosis not present

## 2022-08-01 DIAGNOSIS — I700 Atherosclerosis of aorta: Secondary | ICD-10-CM | POA: Diagnosis not present

## 2022-08-09 DIAGNOSIS — I700 Atherosclerosis of aorta: Secondary | ICD-10-CM | POA: Diagnosis not present

## 2022-08-09 DIAGNOSIS — E785 Hyperlipidemia, unspecified: Secondary | ICD-10-CM | POA: Diagnosis not present

## 2022-08-09 DIAGNOSIS — N184 Chronic kidney disease, stage 4 (severe): Secondary | ICD-10-CM | POA: Diagnosis not present

## 2022-08-09 DIAGNOSIS — I10 Essential (primary) hypertension: Secondary | ICD-10-CM | POA: Diagnosis not present

## 2022-08-09 DIAGNOSIS — E1122 Type 2 diabetes mellitus with diabetic chronic kidney disease: Secondary | ICD-10-CM | POA: Diagnosis not present

## 2022-09-03 ENCOUNTER — Encounter (INDEPENDENT_AMBULATORY_CARE_PROVIDER_SITE_OTHER): Payer: PPO | Admitting: Ophthalmology

## 2022-09-03 DIAGNOSIS — Z794 Long term (current) use of insulin: Secondary | ICD-10-CM | POA: Diagnosis not present

## 2022-09-03 DIAGNOSIS — H33101 Unspecified retinoschisis, right eye: Secondary | ICD-10-CM

## 2022-09-03 DIAGNOSIS — H35033 Hypertensive retinopathy, bilateral: Secondary | ICD-10-CM

## 2022-09-03 DIAGNOSIS — E113393 Type 2 diabetes mellitus with moderate nonproliferative diabetic retinopathy without macular edema, bilateral: Secondary | ICD-10-CM | POA: Diagnosis not present

## 2022-09-03 DIAGNOSIS — I10 Essential (primary) hypertension: Secondary | ICD-10-CM

## 2022-09-03 DIAGNOSIS — H43813 Vitreous degeneration, bilateral: Secondary | ICD-10-CM | POA: Diagnosis not present

## 2022-09-03 DIAGNOSIS — H353132 Nonexudative age-related macular degeneration, bilateral, intermediate dry stage: Secondary | ICD-10-CM | POA: Diagnosis not present

## 2022-09-05 ENCOUNTER — Encounter (INDEPENDENT_AMBULATORY_CARE_PROVIDER_SITE_OTHER): Payer: HMO | Admitting: Ophthalmology

## 2022-09-07 ENCOUNTER — Ambulatory Visit (INDEPENDENT_AMBULATORY_CARE_PROVIDER_SITE_OTHER): Payer: HMO | Admitting: Internal Medicine

## 2022-09-07 ENCOUNTER — Encounter: Payer: Self-pay | Admitting: Internal Medicine

## 2022-09-07 VITALS — BP 178/62 | HR 69 | Temp 98.5°F | Ht 60.0 in | Wt 123.6 lb

## 2022-09-07 DIAGNOSIS — R151 Fecal smearing: Secondary | ICD-10-CM

## 2022-09-07 NOTE — Patient Instructions (Addendum)
It was good to see you again today!  Continue omeprazole 20 mg daily best taken 30 minutes before meal.  For your bowel symptoms lets try increasing the fiber in your diet as follows:  Begin Benefiber 1 tablespoon daily for 3 weeks; then increase to 2 tablespoons daily thereafter-take every day  Informational Kegel exercises to tighten your pelvic muscles/sphincters.  Call me in 2 to 3 weeks to let me know how this regimen is working for you  Plan to see you back in 3 months.

## 2022-09-07 NOTE — Progress Notes (Unsigned)
Primary Care Physician:  Carylon Perches, MD Primary Gastroenterologist:  Dr. Jena Gauss  Pre-Procedure History & Physical: HPI:  Tammy Dyer is a 89 y.o. female here for for further evaluation of fecal smearing small-volume smear passed in her undergarments from time to time.  No bleeding.  May not recognize the need to have a bowel movement.  She has relatively normal-appearing bowel movements on a regular basis.  No out and out diarrhea.  No new medications.  She is not on metformin.  Longstanding diabetes. Admitted to [facility] with a GI bleed EGD and colonoscopy yielded ascending colon AVMs which were ablated.  GERD well-controlled on omeprazole 20 mg daily.  She reports hemoglobin A1c is running in the 7 range.  Past Medical History:  Diagnosis Date   CRI (chronic renal insufficiency)    DM (diabetes mellitus) ([facility])    GERD (gastroesophageal reflux disease)    HTN (hypertension)    Hypercholesterolemia     Past Surgical History:  Procedure Laterality Date   ABDOMINAL HYSTERECTOMY     CATARACT EXTRACTION     CHOLECYSTECTOMY     COLONOSCOPY  2005   Rehman: diverticulosis, hemorrhoids, screening 2015   COLONOSCOPY WITH PROPOFOL N/A 03/19/2020   Procedure: COLONOSCOPY WITH PROPOFOL;  Surgeon: Lanelle Bal, DO;  [facility];  Service: Endoscopy;  Laterality: N/A;   ESOPHAGOGASTRODUODENOSCOPY  10/22/06   normal esophagus/small hiatal hernia   ESOPHAGOGASTRODUODENOSCOPY (EGD) WITH PROPOFOL N/A 03/19/2020   Procedure: ESOPHAGOGASTRODUODENOSCOPY (EGD) WITH PROPOFOL;  Surgeon: Lanelle Bal, DO;  [facility];  Service: Endoscopy;  Laterality: N/A;   HOT HEMOSTASIS  03/19/2020   Procedure: HOT HEMOSTASIS (ARGON PLASMA COAGULATION/BICAP);  Surgeon: Lanelle Bal, DO;  [facility];  Service: Endoscopy;;   POLYPECTOMY  03/19/2020   Procedure: POLYPECTOMY;  Surgeon: Lanelle Bal, DO;  [facility];  Service:  Endoscopy;;  gastric polyp    Prior to Admission medications   Medication Sig Start Date End Date Taking? Authorizing Provider  acetaminophen (TYLENOL) 500 MG tablet Take 1,000 mg by mouth every 6 (six) hours as needed for mild pain or moderate pain.   Yes [provider]  amLODipine (NORVASC) 5 MG tablet Take by mouth. 02/09/20  Yes [provider]  atorvastatin (LIPITOR) 20 MG tablet Take 20 mg by mouth every evening. 03/24/11  Yes [provider]  chlorthalidone (HYGROTON) 25 MG tablet Take 25 mg by mouth daily.   Yes [provider]  Ergocalciferol (VITAMIN D2) 50 MCG (2000 UT) TABS Take 1 tablet by mouth daily.   Yes [provider]  hydrALAZINE (APRESOLINE) 100 MG tablet Take 100 mg by mouth 3 (three) times daily. 04/22/20  Yes [provider]  HYDROcodone-acetaminophen (NORCO/VICODIN) 5-325 MG tablet  04/18/22  Yes [provider]  LANTUS SOLOSTAR 100 UNIT/ML Solostar Pen Inject 20 Units into the skin every evening. 11/20/19  Yes [provider]  Multiple Vitamins-Minerals (PRESERVISION AREDS 2+MULTI VIT PO) Take 1 capsule by mouth in the morning and at bedtime.   Yes [provider]  omeprazole (PRILOSEC) 20 MG capsule Take 1 capsule (20 mg total) by mouth daily. 04/19/15  Yes Carmalita Wakefield, Gerrit Friends, MD  sertraline (ZOLOFT) 100 MG tablet Take 100 mg by mouth daily. 03/31/11  Yes [provider]  trimethoprim (TRIMPEX) 100 MG tablet Take 100 mg by mouth daily.  03/27/14  Yes [provider]    Allergies as  of 09/07/2022 - Review Complete 09/07/2022  Allergen Reaction Noted   Codeine  03/15/2010   Sulfonamide derivatives  03/15/2010    Family History  Problem Relation Age of Onset   Colon cancer Neg Hx     Social History   Socioeconomic History   Marital status: Married    Spouse name: Not on file   Number of children: Not on file   Years of education: Not on file   Highest education level: Not  on file  Occupational History   Not on file  Tobacco Use   Smoking status: Never   Smokeless tobacco: Never  Vaping Use   Vaping status: Never Used  Substance and Sexual Activity   Alcohol use: No   Drug use: No   Sexual activity: Not on file  Other Topics Concern   Not on file  Social History Narrative   Not on file   Social Determinants of Health   Financial Resource Strain: Not on file  Food Insecurity: Not on file  Transportation Needs: Not on file  Physical Activity: Not on file  Stress: Not on file  Social Connections: Not on file  Intimate Partner Violence: Not on file    Review of Systems: See HPI, otherwise negative ROS  Physical Exam: BP (!) 178/62 (BP Location: Right Arm, Patient Position: Sitting, Cuff Size: Normal)   Pulse 69   Temp 98.5 F (36.9 C) (Oral)   Ht 5' (1.524 m)   Wt 123 lb 9.6 oz (56.1 kg)   SpO2 90%   BMI 24.14 kg/m  General:   Alert,  Well-developed, well-nourished, pleasant and cooperative in NAD Abdomen: Non-distended, normal bowel sounds.  Soft and nontender without appreciable mass or hepatosplenomegaly.  Rectal exam: Declined by patient(feels she has had a small accident).  Impression/Plan: 89 year old lady with fecal smearing and is setting of intermittently normal bowel function.  No overt diarrhea.  GI evaluation for bleeding as noted 2 years ago.  This lady is impacted and having spurious leakage. Be more complex than that.  Do not implicating medications.  She does have longstanding diabetes which can cause neuropathy; anal sphincter mechanisms may be failing at advanced age.  Recommendations:  Continue omeprazole 20 mg daily best taken 30 minutes before meal.  For your bowel symptoms lets try increasing the fiber in your diet as follows:  Begin Benefiber 1 tablespoon daily for 3 weeks; then increase to 2 tablespoons daily thereafter-take every day  Informational Kegel exercises to tighten your pelvic  muscles/sphincters.  Call me in 2 to 3 weeks to let me know how this regimen is working for you  Plan to see you back in 3 months.  Notice: This dictation was prepared with Dragon dictation along with smaller phrase technology. Any transcriptional errors that result from this process are unintentional and may not be corrected upon review.

## 2022-10-04 ENCOUNTER — Telehealth: Payer: Self-pay

## 2022-10-04 NOTE — Telephone Encounter (Signed)
Pt called in with phone follow up stating that the recommendations at time of ov is working and that she has only had one episode yesterday but for the most part she is doing fine. Pt will see you in 2 mths.

## 2022-11-08 ENCOUNTER — Encounter: Payer: Self-pay | Admitting: Internal Medicine

## 2022-11-12 DIAGNOSIS — I10 Essential (primary) hypertension: Secondary | ICD-10-CM | POA: Diagnosis not present

## 2022-11-12 DIAGNOSIS — E1129 Type 2 diabetes mellitus with other diabetic kidney complication: Secondary | ICD-10-CM | POA: Diagnosis not present

## 2022-11-12 DIAGNOSIS — E785 Hyperlipidemia, unspecified: Secondary | ICD-10-CM | POA: Diagnosis not present

## 2022-11-12 DIAGNOSIS — Z79899 Other long term (current) drug therapy: Secondary | ICD-10-CM | POA: Diagnosis not present

## 2022-11-12 DIAGNOSIS — N184 Chronic kidney disease, stage 4 (severe): Secondary | ICD-10-CM | POA: Diagnosis not present

## 2022-11-21 DIAGNOSIS — Z23 Encounter for immunization: Secondary | ICD-10-CM | POA: Diagnosis not present

## 2022-11-21 DIAGNOSIS — D649 Anemia, unspecified: Secondary | ICD-10-CM | POA: Diagnosis not present

## 2022-11-21 DIAGNOSIS — E1122 Type 2 diabetes mellitus with diabetic chronic kidney disease: Secondary | ICD-10-CM | POA: Diagnosis not present

## 2022-11-21 DIAGNOSIS — I10 Essential (primary) hypertension: Secondary | ICD-10-CM | POA: Diagnosis not present

## 2022-11-26 ENCOUNTER — Ambulatory Visit (INDEPENDENT_AMBULATORY_CARE_PROVIDER_SITE_OTHER): Payer: HMO | Admitting: Internal Medicine

## 2022-11-26 ENCOUNTER — Encounter: Payer: Self-pay | Admitting: Internal Medicine

## 2022-11-26 VITALS — BP 188/66 | HR 73 | Temp 97.8°F | Ht 60.0 in | Wt 131.8 lb

## 2022-11-26 DIAGNOSIS — R151 Fecal smearing: Secondary | ICD-10-CM

## 2022-11-26 DIAGNOSIS — K219 Gastro-esophageal reflux disease without esophagitis: Secondary | ICD-10-CM

## 2022-11-26 MED ORDER — CHOLESTYRAMINE 4 G PO PACK: 2.0000 g | PACK | Freq: Every day | ORAL | 11 refills | Status: DC

## 2022-11-26 NOTE — Patient Instructions (Signed)
It was nice to see you again today!  Lets stop the Benefiber  Will start a trial of Questran or cholestyramine 2 g orally daily dispense 30 doses with 11 refills  This medication is made for high triglycerides.  We are utilizing a side effect of this medication which causes firming up of the stool.  Hopefully, will improve on episodes of leakage/seepage even further.  It is very important not to take this medication within 2 hours before or after any other medications that you take.  Continue omeprazole 20 mg daily.  Office visit with me in 6 weeks

## 2022-11-26 NOTE — Progress Notes (Signed)
Primary Care Physician:  Carylon Perches, MD Primary Gastroenterologist:  Dr. Jena Gauss  Pre-Procedure History & Physical: HPI:  Tammy Dyer is a 89 y.o. female here for follow-up of fecal seepage leakage.  On Benefiber 2 tablespoons daily.  Notable improvement in fecal seepage but still continues to have episodes multiple times monthly.  No bleeding.  GERD well-controlled on omeprazole 20 mg daily.  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 Past Medical History:  Diagnosis Date   CRI (chronic renal insufficiency)    DM (diabetes mellitus) (HCC)    GERD (gastroesophageal reflux disease)    HTN (hypertension)    Hypercholesterolemia     Past Surgical History:  Procedure Laterality Date   ABDOMINAL HYSTERECTOMY     CATARACT EXTRACTION     CHOLECYSTECTOMY     COLONOSCOPY  2005   Rehman: diverticulosis, hemorrhoids, screening 2015   COLONOSCOPY WITH PROPOFOL N/A 03/19/2020   Procedure: COLONOSCOPY WITH PROPOFOL;  Surgeon: Lanelle Bal, DO;  Location: [facility];  Service: Endoscopy;  Laterality: N/A;   ESOPHAGOGASTRODUODENOSCOPY  10/22/06   normal esophagus/small hiatal hernia   ESOPHAGOGASTRODUODENOSCOPY (EGD) WITH PROPOFOL N/A 03/19/2020   Procedure: ESOPHAGOGASTRODUODENOSCOPY (EGD) WITH PROPOFOL;  Surgeon: Lanelle Bal, DO;  Location: [facility];  Service: Endoscopy;  Laterality: N/A;   HOT HEMOSTASIS  03/19/2020   Procedure: HOT HEMOSTASIS (ARGON PLASMA COAGULATION/BICAP);  Surgeon: Lanelle Bal, DO;  Location: [facility];  Service: Endoscopy;;   POLYPECTOMY  03/19/2020   Procedure: POLYPECTOMY;  Surgeon: Lanelle Bal, DO;  Location: [facility];  Service: Endoscopy;;  gastric polyp    Prior to Admission medications   Medication Sig Start Date End Date Taking? Authorizing Provider  acetaminophen (TYLENOL) 500 MG tablet Take 1,000 mg by mouth every 6 (six) hours as needed for mild pain or moderate pain.   Yes [provider]  amLODipine (NORVASC) 5 MG tablet Take by mouth. 02/09/20  Yes [provider]  atorvastatin (LIPITOR) 20 MG tablet Take 20 mg by mouth every evening. 03/24/11  Yes [provider]  chlorthalidone (HYGROTON) 25 MG tablet Take 25 mg by mouth daily.   Yes [provider]  Ergocalciferol (VITAMIN D2) 50 MCG (2000 UT) TABS Take 1 tablet by mouth daily.   Yes [provider]  furosemide (LASIX) 20 MG tablet Take 20 mg by mouth every morning. 11/21/22  Yes [provider]  hydrALAZINE (APRESOLINE) 100 MG tablet Take 100 mg by mouth 3 (three) times daily. 04/22/20  Yes [provider]  HYDROcodone-acetaminophen (NORCO/VICODIN) 5-325 MG tablet  04/18/22  Yes [provider]  LANTUS SOLOSTAR 100 UNIT/ML Solostar Pen Inject 20 Units into the skin every evening. 11/20/19  Yes [provider]  Multiple Vitamins-Minerals (PRESERVISION AREDS 2+MULTI VIT PO) Take 1 capsule by mouth in the morning and at bedtime.   Yes [provider]  omeprazole (PRILOSEC) 20 MG capsule Take 1 capsule (20 mg total) by mouth daily. 04/19/15  Yes Hunt Zajicek, Gerrit Friends, MD  sertraline (ZOLOFT) 100 MG tablet Take 100 mg by mouth daily. 03/31/11  Yes [provider]  trimethoprim (TRIMPEX) 100 MG tablet Take 100 mg by mouth daily.  03/27/14  Yes [provider]    Allergies as of 11/26/2022 - Review Complete 11/26/2022  Allergen Reaction Noted   Codeine  03/15/2010   Sulfonamide derivatives  03/15/2010    Family History  Problem Relation Age of Onset  Colon cancer Neg Hx     Social History   Socioeconomic History   Marital status: Married    Spouse name: Not on file   Number of children: Not on file   Years of education: Not on file   Highest education level: Not on file  Occupational History   Not on file  Tobacco Use   Smoking status: Never   Smokeless tobacco: Never  Vaping Use   Vaping status: Never Used   Substance and Sexual Activity   Alcohol use: No   Drug use: No   Sexual activity: Not on file  Other Topics Concern   Not on file  Social History Narrative   Not on file   Social Determinants of Health   Financial Resource Strain: Not on file  Food Insecurity: Not on file  Transportation Needs: Not on file  Physical Activity: Not on file  Stress: Not on file  Social Connections: Not on file  Intimate Partner Violence: Not on file    Review of Systems: See HPI, otherwise negative ROS  Physical Exam: BP (!) 188/66 (BP Location: Left Arm, Patient Position: Sitting, Cuff Size: Normal)   Pulse 73   Temp 97.8 F (36.6 C) (Oral)   Ht 5' (1.524 m)   Wt 131 lb 12.8 oz (59.8 kg)   SpO2 97%   BMI 25.74 kg/m  General:   Alert,  Well-developed, well-nourished, pleasant and cooperative in NAD Abdomen: Non-distended, normal bowel sounds.  Soft and nontender without appreciable mass or hepatosplenomegaly.   Impression/Plan: 89 year old lady with episodes of fecal seepage leakage somewhat improved with daily fiber supplement.  However, we have not improved symptoms to a satisfactory degree as of yet.  Will likely do better on a resin binder.  Recommendations:  stop the Benefiber  Will start a trial of Questran or cholestyramine 2 g orally daily dispense 30 doses with 11 refills  This medication is made for high triglycerides.  We are utilizing a side effect of this medication which causes firming up of the stool.  Hopefully, will improve episodes of leakage/seepage even further.  It is very important not to take this medication within 2 hours before or after any other medications that you take.  Continue omeprazole 20 mg daily.  Office visit with me in 6 weeks      Notice: This dictation was prepared with Dragon dictation along with smaller phrase technology. Any transcriptional errors that result from this process are unintentional and may not be corrected upon review.

## 2022-12-21 ENCOUNTER — Other Ambulatory Visit: Payer: Self-pay

## 2022-12-21 ENCOUNTER — Emergency Department (HOSPITAL_COMMUNITY): Payer: PPO

## 2022-12-21 ENCOUNTER — Emergency Department (HOSPITAL_COMMUNITY)
Admission: EM | Admit: 2022-12-21 | Discharge: 2022-12-22 | Disposition: A | Discharge status: Home / Self Care | Payer: PPO | Attending: Emergency Medicine | Admitting: Emergency Medicine

## 2022-12-21 ENCOUNTER — Encounter (HOSPITAL_COMMUNITY): Payer: Self-pay | Admitting: Emergency Medicine

## 2022-12-21 DIAGNOSIS — Z79899 Other long term (current) drug therapy: Secondary | ICD-10-CM | POA: Diagnosis not present

## 2022-12-21 DIAGNOSIS — R030 Elevated blood-pressure reading, without diagnosis of hypertension: Secondary | ICD-10-CM | POA: Diagnosis not present

## 2022-12-21 DIAGNOSIS — S0990XA Unspecified injury of head, initial encounter: Secondary | ICD-10-CM | POA: Diagnosis not present

## 2022-12-21 DIAGNOSIS — Z794 Long term (current) use of insulin: Secondary | ICD-10-CM | POA: Diagnosis not present

## 2022-12-21 DIAGNOSIS — G319 Degenerative disease of nervous system, unspecified: Secondary | ICD-10-CM | POA: Diagnosis not present

## 2022-12-21 DIAGNOSIS — M542 Cervicalgia: Secondary | ICD-10-CM | POA: Insufficient documentation

## 2022-12-21 DIAGNOSIS — R519 Headache, unspecified: Secondary | ICD-10-CM | POA: Diagnosis not present

## 2022-12-21 DIAGNOSIS — I10 Essential (primary) hypertension: Secondary | ICD-10-CM | POA: Diagnosis not present

## 2022-12-21 DIAGNOSIS — S0011XA Contusion of right eyelid and periocular area, initial encounter: Secondary | ICD-10-CM | POA: Diagnosis not present

## 2022-12-21 DIAGNOSIS — W19XXXA Unspecified fall, initial encounter: Secondary | ICD-10-CM | POA: Diagnosis not present

## 2022-12-21 DIAGNOSIS — S46911A Strain of unspecified muscle, fascia and tendon at shoulder and upper arm level, right arm, initial encounter: Secondary | ICD-10-CM | POA: Diagnosis not present

## 2022-12-21 DIAGNOSIS — M25511 Pain in right shoulder: Secondary | ICD-10-CM | POA: Insufficient documentation

## 2022-12-21 DIAGNOSIS — M19011 Primary osteoarthritis, right shoulder: Secondary | ICD-10-CM | POA: Diagnosis not present

## 2022-12-21 DIAGNOSIS — S0083XA Contusion of other part of head, initial encounter: Secondary | ICD-10-CM | POA: Diagnosis not present

## 2022-12-21 DIAGNOSIS — S0993XA Unspecified injury of face, initial encounter: Secondary | ICD-10-CM | POA: Diagnosis present

## 2022-12-21 NOTE — ED Triage Notes (Signed)
Pt states she fell Wednesday evening. Denies LOC. C/O Rt shoulder pain. Pt with hematoma above rt eyebrow as well as to rt orbital area.

## 2022-12-22 ENCOUNTER — Emergency Department (HOSPITAL_COMMUNITY): Payer: PPO

## 2022-12-22 DIAGNOSIS — S0083XA Contusion of other part of head, initial encounter: Secondary | ICD-10-CM | POA: Diagnosis not present

## 2022-12-22 DIAGNOSIS — G319 Degenerative disease of nervous system, unspecified: Secondary | ICD-10-CM | POA: Diagnosis not present

## 2022-12-22 DIAGNOSIS — R519 Headache, unspecified: Secondary | ICD-10-CM | POA: Diagnosis not present

## 2022-12-22 DIAGNOSIS — M542 Cervicalgia: Secondary | ICD-10-CM | POA: Diagnosis not present

## 2022-12-22 LAB — CBC WITH DIFFERENTIAL/PLATELET
Abs Immature Granulocytes: 0.01 10*3/uL (ref 0.00–0.07)
Basophils Absolute: 0 10*3/uL (ref 0.0–0.1)
Basophils Relative: 0 %
Eosinophils Absolute: 0.2 10*3/uL (ref 0.0–0.5)
Eosinophils Relative: 4 %
HCT: 27.1 % — ABNORMAL LOW (ref 36.0–46.0)
Hemoglobin: 8.8 g/dL — ABNORMAL LOW (ref 12.0–15.0)
Immature Granulocytes: 0 %
Lymphocytes Relative: 19 %
Lymphs Abs: 1.1 10*3/uL (ref 0.7–4.0)
MCH: 28.8 pg (ref 26.0–34.0)
MCHC: 32.5 g/dL (ref 30.0–36.0)
MCV: 88.6 fL (ref 80.0–100.0)
Monocytes Absolute: 0.5 10*3/uL (ref 0.1–1.0)
Monocytes Relative: 9 %
Neutro Abs: 3.8 10*3/uL (ref 1.7–7.7)
Neutrophils Relative %: 68 %
Platelets: 170 10*3/uL (ref 150–400)
RBC: 3.06 MIL/uL — ABNORMAL LOW (ref 3.87–5.11)
RDW: 14.6 % (ref 11.5–15.5)
WBC: 5.7 10*3/uL (ref 4.0–10.5)
nRBC: 0 % (ref 0.0–0.2)

## 2022-12-22 LAB — COMPREHENSIVE METABOLIC PANEL
ALT: 23 U/L (ref 0–44)
AST: 30 U/L (ref 15–41)
Albumin: 3.1 g/dL — ABNORMAL LOW (ref 3.5–5.0)
Alkaline Phosphatase: 69 U/L (ref 38–126)
Anion gap: 12 (ref 5–15)
BUN: 37 mg/dL — ABNORMAL HIGH (ref 8–23)
CO2: 24 mmol/L (ref 22–32)
Calcium: 9 mg/dL (ref 8.9–10.3)
Chloride: 104 mmol/L (ref 98–111)
Creatinine, Ser: 2.02 mg/dL — ABNORMAL HIGH (ref 0.44–1.00)
GFR, Estimated: 23 mL/min — ABNORMAL LOW (ref >60)
Glucose, Bld: 192 mg/dL — ABNORMAL HIGH (ref 70–99)
Potassium: 3.4 mmol/L — ABNORMAL LOW (ref 3.5–5.1)
Sodium: 140 mmol/L (ref 135–145)
Total Bilirubin: 0.7 mg/dL (ref <1.2)
Total Protein: 6.2 g/dL — ABNORMAL LOW (ref 6.5–8.1)

## 2022-12-22 MED ORDER — OXYCODONE-ACETAMINOPHEN 5-325 MG PO TABS
1.0000 | ORAL_TABLET | Freq: Once | ORAL | Status: AC
  Administered 2022-12-22: 1 via ORAL
  Filled 2022-12-22: qty 1

## 2022-12-22 MED ORDER — AMLODIPINE BESYLATE 10 MG PO TABS: 10.0000 mg | ORAL_TABLET | Freq: Every day | ORAL | 1 refills | Status: DC

## 2022-12-22 NOTE — ED Provider Notes (Signed)
[place] EMERGENCY DEPARTMENT AT [facility] Provider Note   CSN: [id number] Arrival date & time: 12/21/22  2325     History  Chief Complaint  Patient presents with   Shoulder Pain   Fall    Tammy Dyer is a 89 y.o. female.  A 73-year-old female who presents with her son secondary to a fall.  States she fell on Wednesday.  She had some neck pain and face pain.  The face pain seem to be improved but she has persistent hematoma.  However the neck pain has not and that so she is worried well so she presents here to make sure she do not have any broken spine or other associated injuries.  No injuries anywhere else.   Shoulder Pain Fall       Home Medications Prior to Admission medications   Medication Sig Start Date End Date Taking? Authorizing Provider  acetaminophen (TYLENOL) 500 MG tablet Take 1,000 mg by mouth every 6 (six) hours as needed for mild pain or moderate pain.    [provider]  amLODipine (NORVASC) 10 MG tablet Take 1 tablet (10 mg total) by mouth daily. 12/22/22   Soliyana Mcchristian, Barbara Cower, MD  atorvastatin (LIPITOR) 20 MG tablet Take 20 mg by mouth every evening. 03/24/11   [provider]  chlorthalidone (HYGROTON) 25 MG tablet Take 25 mg by mouth daily.    [provider]  cholestyramine (QUESTRAN) 4 g packet Take 0.5 packets (2 g total) by mouth daily. 11/26/22   Rourk, Gerrit Friends, MD  Ergocalciferol (VITAMIN D2) 50 MCG (2000 UT) TABS Take 1 tablet by mouth daily.    [provider]  furosemide (LASIX) 20 MG tablet Take 20 mg by mouth every morning. 11/21/22   [provider]  hydrALAZINE (APRESOLINE) 100 MG tablet Take 100 mg by mouth 3 (three) times daily. 04/22/20   [provider]  HYDROcodone-acetaminophen (NORCO/VICODIN) 5-325 MG tablet  04/18/22   [provider]  LANTUS SOLOSTAR 100 UNIT/ML Solostar Pen Inject 20 Units into the skin every evening. 11/20/19   [provider]   Multiple Vitamins-Minerals (PRESERVISION AREDS 2+MULTI VIT PO) Take 1 capsule by mouth in the morning and at bedtime.    [provider]  omeprazole (PRILOSEC) 20 MG capsule Take 1 capsule (20 mg total) by mouth daily. 04/19/15   Rourk, Gerrit Friends, MD  sertraline (ZOLOFT) 100 MG tablet Take 100 mg by mouth daily. 03/31/11   [provider]  trimethoprim (TRIMPEX) 100 MG tablet Take 100 mg by mouth daily.  03/27/14   [provider]      Allergies    Codeine and Sulfonamide derivatives    Review of Systems   Review of Systems  Physical Exam Updated Vital Signs BP (!) 182/66   Pulse 74   Temp 98.2 F (36.8 C) (Oral)   Resp 18   Ht 5' (1.524 m)   Wt 61.2 kg   SpO2 98%   BMI 26.37 kg/m  Physical Exam Vitals and nursing note reviewed.  HENT:     Head:     Comments: Hematoma and contusion around right eye without globe injury Musculoskeletal:     Comments: Lower cervical spine ttp without deformity or stepoff  Neurological:     Comments: No altered mental status, able to give full seemingly accurate history.  Face is symmetric, EOM's intact, pupils equal and reactive, vision intact, tongue and uvula midline without deviation. Upper and Lower extremity motor 5/5,  intact pain perception in distal extremities, 2+ reflexes in biceps, patella and achilles tendons. Able to perform finger to nose normal with both hands. Walks without assistance or evident ataxia.       ED Results / Procedures / Treatments   Labs (all labs ordered are listed, but only abnormal results are displayed) Labs Reviewed  CBC WITH DIFFERENTIAL/PLATELET - Abnormal; Notable for the following components:      Result Value   RBC 3.06 (*)    Hemoglobin 8.8 (*)    HCT 27.1 (*)    All other components within normal limits  COMPREHENSIVE METABOLIC PANEL - Abnormal; Notable for the following components:   Potassium 3.4 (*)    Glucose, Bld 192 (*)    BUN 37 (*)    Creatinine, Ser 2.02 (*)     Total Protein 6.2 (*)    Albumin 3.1 (*)    GFR, Estimated 23 (*)    All other components within normal limits    EKG None  Radiology CT Head Wo Contrast  Result Date: 12/22/2022 CLINICAL DATA:  Larey Seat 3 days ago, right eyebrow hematoma and pain. Head and neck pain. EXAM: CT HEAD WITHOUT CONTRAST CT MAXILLOFACIAL WITHOUT CONTRAST CT CERVICAL SPINE WITHOUT CONTRAST TECHNIQUE: Multidetector CT imaging of the head, cervical spine, and maxillofacial structures were performed using the standard protocol without intravenous contrast. Multiplanar CT image reconstructions of the cervical spine and maxillofacial structures were also generated. RADIATION DOSE REDUCTION: This exam was performed according to the departmental dose-optimization program which includes automated exposure control, adjustment of the mA and/or kV according to patient size and/or use of iterative reconstruction technique. COMPARISON:  Only relevant comparison is flexion and extension lateral cervical spine views 01/02/2022. FINDINGS: CT HEAD FINDINGS Brain: There is mild-to-moderate cerebral atrophy, small-vessel disease, and atrophic ventriculomegaly, without midline shift. Cerebellum and brainstem are unremarkable. No acute cortical based infarct, hemorrhage or mass are seen and no mass effect. Basal cisterns are clear. Vascular: No hyperdense vessel or unexpected calcification. Skull: Negative for fractures or focal lesions. Other: None. CT MAXILLOFACIAL FINDINGS Osseous: No fracture or mandibular dislocation. No destructive process. There are bilateral unerupted maxillary wisdom teeth. Incidental note is made of a torus palatinus. Orbits: Negative. No traumatic or inflammatory finding. There have been previous bilateral lens replacements. Sinuses: Clear apart from mild disease in the left locule of the sphenoid sinus. No mastoid effusion. Normal ostiomeatal complexes. Midline nasal septum with slight left-sided spurring. Negative  turbinates. Soft tissues: Small subcutaneous bleed partially visible right brow area. Mild to moderate right periorbital edema. Mild edema extends over the anterior right cheek. CT CERVICAL SPINE FINDINGS Alignment: Minimal chronic grade 1 anterolisthesis is again noted at C4-5 and C7-T1 likely degenerative, unchanged. There is no new or progressive alignment abnormality. Narrowing and spurring noted of the anterior atlantodental joint. Skull base and vertebrae: There is osteopenia without evidence of fractures. No primary bone lesion or focal pathologic process. There are scattered endplate Schmorl's nodes. Soft tissues and spinal canal: No prevertebral fluid or swelling. No visible canal hematoma. There are mild calcifications in the proximal cervical ICAs. No thyroid nodules. There is no laryngeal mass. Disc levels: There are normal disc heights at C2-3, C3-4 and C7-T1 with collapsed discs C5-6, C6-7 and T1-2 and partial disc space loss C4-5. There are bidirectional osteophytes at C5-6 and C6-7, at C5-6 slightly compressing the left hemicord. Other levels do not show significant soft tissue or bony encroachment on the thecal sac. There is uncinate joint  and facet hypertrophy at most levels. Acquired foraminal stenosis is bilaterally moderate C3-4, severe on the left and mild on the right C4-5, bilaterally moderate to severe C5-6, bilaterally moderate at C6-7, and moderate to severe on the right at T1-2. Upper chest: There are left-greater-than-right pleural-parenchymal linear calcifications in the lung apices, mild scarring change. Otherwise negative. Other: None. IMPRESSION: 1. No acute intracranial CT findings or depressed skull fractures. 2. Atrophy and small-vessel disease. 3. Right periorbital and anterior cheek edema with small subcutaneous bleed in the right brow area. 4. No facial fractures are seen. 5. Osteopenia and degenerative change without evidence of cervical fractures. 6. Minimal chronic grade 1  anterolisthesis C4-5 and C7 -T 1. 7. Carotid atherosclerosis. Electronically Signed   By: Almira Bar M.D.   On: 12/22/2022 01:50   CT Maxillofacial Wo Contrast  Result Date: 12/22/2022 CLINICAL DATA:  Larey Seat 3 days ago, right eyebrow hematoma and pain. Head and neck pain. EXAM: CT HEAD WITHOUT CONTRAST CT MAXILLOFACIAL WITHOUT CONTRAST CT CERVICAL SPINE WITHOUT CONTRAST TECHNIQUE: Multidetector CT imaging of the head, cervical spine, and maxillofacial structures were performed using the standard protocol without intravenous contrast. Multiplanar CT image reconstructions of the cervical spine and maxillofacial structures were also generated. RADIATION DOSE REDUCTION: This exam was performed according to the departmental dose-optimization program which includes automated exposure control, adjustment of the mA and/or kV according to patient size and/or use of iterative reconstruction technique. COMPARISON:  Only relevant comparison is flexion and extension lateral cervical spine views 01/02/2022. FINDINGS: CT HEAD FINDINGS Brain: There is mild-to-moderate cerebral atrophy, small-vessel disease, and atrophic ventriculomegaly, without midline shift. Cerebellum and brainstem are unremarkable. No acute cortical based infarct, hemorrhage or mass are seen and no mass effect. Basal cisterns are clear. Vascular: No hyperdense vessel or unexpected calcification. Skull: Negative for fractures or focal lesions. Other: None. CT MAXILLOFACIAL FINDINGS Osseous: No fracture or mandibular dislocation. No destructive process. There are bilateral unerupted maxillary wisdom teeth. Incidental note is made of a torus palatinus. Orbits: Negative. No traumatic or inflammatory finding. There have been previous bilateral lens replacements. Sinuses: Clear apart from mild disease in the left locule of the sphenoid sinus. No mastoid effusion. Normal ostiomeatal complexes. Midline nasal septum with slight left-sided spurring. Negative  turbinates. Soft tissues: Small subcutaneous bleed partially visible right brow area. Mild to moderate right periorbital edema. Mild edema extends over the anterior right cheek. CT CERVICAL SPINE FINDINGS Alignment: Minimal chronic grade 1 anterolisthesis is again noted at C4-5 and C7-T1 likely degenerative, unchanged. There is no new or progressive alignment abnormality. Narrowing and spurring noted of the anterior atlantodental joint. Skull base and vertebrae: There is osteopenia without evidence of fractures. No primary bone lesion or focal pathologic process. There are scattered endplate Schmorl's nodes. Soft tissues and spinal canal: No prevertebral fluid or swelling. No visible canal hematoma. There are mild calcifications in the proximal cervical ICAs. No thyroid nodules. There is no laryngeal mass. Disc levels: There are normal disc heights at C2-3, C3-4 and C7-T1 with collapsed discs C5-6, C6-7 and T1-2 and partial disc space loss C4-5. There are bidirectional osteophytes at C5-6 and C6-7, at C5-6 slightly compressing the left hemicord. Other levels do not show significant soft tissue or bony encroachment on the thecal sac. There is uncinate joint and facet hypertrophy at most levels. Acquired foraminal stenosis is bilaterally moderate C3-4, severe on the left and mild on the right C4-5, bilaterally moderate to severe C5-6, bilaterally moderate at C6-7, and moderate to severe  on the right at T1-2. Upper chest: There are left-greater-than-right pleural-parenchymal linear calcifications in the lung apices, mild scarring change. Otherwise negative. Other: None. IMPRESSION: 1. No acute intracranial CT findings or depressed skull fractures. 2. Atrophy and small-vessel disease. 3. Right periorbital and anterior cheek edema with small subcutaneous bleed in the right brow area. 4. No facial fractures are seen. 5. Osteopenia and degenerative change without evidence of cervical fractures. 6. Minimal chronic grade 1  anterolisthesis C4-5 and C7 -T 1. 7. Carotid atherosclerosis. Electronically Signed   By: Almira Bar M.D.   On: 12/22/2022 01:50   CT Cervical Spine Wo Contrast  Result Date: 12/22/2022 CLINICAL DATA:  Larey Seat 3 days ago, right eyebrow hematoma and pain. Head and neck pain. EXAM: CT HEAD WITHOUT CONTRAST CT MAXILLOFACIAL WITHOUT CONTRAST CT CERVICAL SPINE WITHOUT CONTRAST TECHNIQUE: Multidetector CT imaging of the head, cervical spine, and maxillofacial structures were performed using the standard protocol without intravenous contrast. Multiplanar CT image reconstructions of the cervical spine and maxillofacial structures were also generated. RADIATION DOSE REDUCTION: This exam was performed according to the departmental dose-optimization program which includes automated exposure control, adjustment of the mA and/or kV according to patient size and/or use of iterative reconstruction technique. COMPARISON:  Only relevant comparison is flexion and extension lateral cervical spine views 01/02/2022. FINDINGS: CT HEAD FINDINGS Brain: There is mild-to-moderate cerebral atrophy, small-vessel disease, and atrophic ventriculomegaly, without midline shift. Cerebellum and brainstem are unremarkable. No acute cortical based infarct, hemorrhage or mass are seen and no mass effect. Basal cisterns are clear. Vascular: No hyperdense vessel or unexpected calcification. Skull: Negative for fractures or focal lesions. Other: None. CT MAXILLOFACIAL FINDINGS Osseous: No fracture or mandibular dislocation. No destructive process. There are bilateral unerupted maxillary wisdom teeth. Incidental note is made of a torus palatinus. Orbits: Negative. No traumatic or inflammatory finding. There have been previous bilateral lens replacements. Sinuses: Clear apart from mild disease in the left locule of the sphenoid sinus. No mastoid effusion. Normal ostiomeatal complexes. Midline nasal septum with slight left-sided spurring. Negative  turbinates. Soft tissues: Small subcutaneous bleed partially visible right brow area. Mild to moderate right periorbital edema. Mild edema extends over the anterior right cheek. CT CERVICAL SPINE FINDINGS Alignment: Minimal chronic grade 1 anterolisthesis is again noted at C4-5 and C7-T1 likely degenerative, unchanged. There is no new or progressive alignment abnormality. Narrowing and spurring noted of the anterior atlantodental joint. Skull base and vertebrae: There is osteopenia without evidence of fractures. No primary bone lesion or focal pathologic process. There are scattered endplate Schmorl's nodes. Soft tissues and spinal canal: No prevertebral fluid or swelling. No visible canal hematoma. There are mild calcifications in the proximal cervical ICAs. No thyroid nodules. There is no laryngeal mass. Disc levels: There are normal disc heights at C2-3, C3-4 and C7-T1 with collapsed discs C5-6, C6-7 and T1-2 and partial disc space loss C4-5. There are bidirectional osteophytes at C5-6 and C6-7, at C5-6 slightly compressing the left hemicord. Other levels do not show significant soft tissue or bony encroachment on the thecal sac. There is uncinate joint and facet hypertrophy at most levels. Acquired foraminal stenosis is bilaterally moderate C3-4, severe on the left and mild on the right C4-5, bilaterally moderate to severe C5-6, bilaterally moderate at C6-7, and moderate to severe on the right at T1-2. Upper chest: There are left-greater-than-right pleural-parenchymal linear calcifications in the lung apices, mild scarring change. Otherwise negative. Other: None. IMPRESSION: 1. No acute intracranial CT findings or depressed skull fractures.  2. Atrophy and small-vessel disease. 3. Right periorbital and anterior cheek edema with small subcutaneous bleed in the right brow area. 4. No facial fractures are seen. 5. Osteopenia and degenerative change without evidence of cervical fractures. 6. Minimal chronic grade 1  anterolisthesis C4-5 and C7 -T 1. 7. Carotid atherosclerosis. Electronically Signed   By: Almira Bar M.D.   On: 12/22/2022 01:50   DG Shoulder Right  Result Date: 12/22/2022 CLINICAL DATA:  Fall with pain EXAM: RIGHT SHOULDER - 2+ VIEW COMPARISON:  Chest x-ray 02/17/2007 FINDINGS: There is no evidence of fracture or dislocation. Degenerative changes. Soft tissues are unremarkable. IMPRESSION: No acute displaced fracture or dislocation. Electronically Signed   By: Tish Frederickson M.D.   On: 12/22/2022 00:04    Procedures Procedures    Medications Ordered in ED Medications  oxyCODONE-acetaminophen (PERCOCET/ROXICET) 5-325 MG per tablet 1 tablet (1 tablet Oral Given 12/22/22 0028)    ED Course/ Medical Decision Making/ A&P                                 Medical Decision Making Amount and/or Complexity of Data Reviewed Labs: ordered. Radiology: ordered.  Risk Prescription drug management.   Patient with mechanical fall couple days ago persistent neck pain.  CT of her neck is normal.  Neurologic exam is normal.  Suspect is muscular in nature.  Significant trauma to her face as well but no lacerations, fractures there either.  No visual changes to suggest globe injury.  Will continue symptomatic care at home and PCP follow-up.        Final Clinical Impression(s) / ED Diagnoses Final diagnoses:  Fall, initial encounter  Uncontrolled hypertension    Rx / DC Orders ED Discharge Orders          Ordered    amLODipine (NORVASC) 10 MG tablet  Daily        12/22/22 0300              Analisse Randle, Barbara Cower, MD 12/22/22 [phone]

## 2023-01-02 DIAGNOSIS — H00025 Hordeolum internum left lower eyelid: Secondary | ICD-10-CM | POA: Diagnosis not present

## 2023-01-11 ENCOUNTER — Ambulatory Visit: Payer: PPO | Admitting: Internal Medicine

## 2023-03-12 DIAGNOSIS — E1129 Type 2 diabetes mellitus with other diabetic kidney complication: Secondary | ICD-10-CM | POA: Diagnosis not present

## 2023-03-12 DIAGNOSIS — Z79899 Other long term (current) drug therapy: Secondary | ICD-10-CM | POA: Diagnosis not present

## 2023-03-12 DIAGNOSIS — I10 Essential (primary) hypertension: Secondary | ICD-10-CM | POA: Diagnosis not present

## 2023-03-12 DIAGNOSIS — N184 Chronic kidney disease, stage 4 (severe): Secondary | ICD-10-CM | POA: Diagnosis not present

## 2023-03-12 DIAGNOSIS — D509 Iron deficiency anemia, unspecified: Secondary | ICD-10-CM | POA: Diagnosis not present

## 2023-03-21 DIAGNOSIS — N184 Chronic kidney disease, stage 4 (severe): Secondary | ICD-10-CM | POA: Diagnosis not present

## 2023-03-21 DIAGNOSIS — I10 Essential (primary) hypertension: Secondary | ICD-10-CM | POA: Diagnosis not present

## 2023-03-21 DIAGNOSIS — R609 Edema, unspecified: Secondary | ICD-10-CM | POA: Diagnosis not present

## 2023-03-21 DIAGNOSIS — E1122 Type 2 diabetes mellitus with diabetic chronic kidney disease: Secondary | ICD-10-CM | POA: Diagnosis not present

## 2023-04-16 ENCOUNTER — Telehealth: Payer: Self-pay

## 2023-04-16 NOTE — Progress Notes (Signed)
   04/16/2023  Patient ID: Tammy Dyer, female   DOB: Feb 12, 1933, 89 y.o.   MRN: [id number]   Patient appeared on insurance report for not passing the quality metrics in 2024:  Medication Adherence for Cholesterol (MAC)   Outreach to the patient was not needed today.   Meds Tracking:  -Atorvastatin 20 mg - Last filled 90DS on 04/15/23, last LDL 79 on 08/03/22, does not qualify for metric yet. Next fill due 07/14/23.  Plan:  Lipids close to goal, can maximize atorvastatin if needed. No renal dosage adjustment necessary despite stage 4 CKD. PCP monitoring CKD closely, scheduled review of fill history on 07/18/23.   Fayette Pho, PharmD

## 2023-06-10 DIAGNOSIS — E1129 Type 2 diabetes mellitus with other diabetic kidney complication: Secondary | ICD-10-CM | POA: Diagnosis not present

## 2023-06-10 DIAGNOSIS — Z79899 Other long term (current) drug therapy: Secondary | ICD-10-CM | POA: Diagnosis not present

## 2023-06-10 DIAGNOSIS — N184 Chronic kidney disease, stage 4 (severe): Secondary | ICD-10-CM | POA: Diagnosis not present

## 2023-06-10 DIAGNOSIS — I10 Essential (primary) hypertension: Secondary | ICD-10-CM | POA: Diagnosis not present

## 2023-06-10 DIAGNOSIS — R609 Edema, unspecified: Secondary | ICD-10-CM | POA: Diagnosis not present

## 2023-06-19 DIAGNOSIS — E1122 Type 2 diabetes mellitus with diabetic chronic kidney disease: Secondary | ICD-10-CM | POA: Diagnosis not present

## 2023-06-19 DIAGNOSIS — N184 Chronic kidney disease, stage 4 (severe): Secondary | ICD-10-CM | POA: Diagnosis not present

## 2023-06-19 DIAGNOSIS — I10 Essential (primary) hypertension: Secondary | ICD-10-CM | POA: Diagnosis not present

## 2023-07-18 ENCOUNTER — Telehealth: Payer: Self-pay

## 2023-07-18 NOTE — Telephone Encounter (Signed)
 Up to date on atorvastatin , next review in September

## 2023-07-30 ENCOUNTER — Ambulatory Visit: Admitting: Internal Medicine

## 2023-07-30 ENCOUNTER — Encounter: Payer: Self-pay | Admitting: Internal Medicine

## 2023-07-30 VITALS — BP 185/66 | HR 79 | Temp 98.8°F | Ht 61.0 in | Wt 137.0 lb

## 2023-07-30 DIAGNOSIS — R151 Fecal smearing: Secondary | ICD-10-CM

## 2023-07-30 DIAGNOSIS — K219 Gastro-esophageal reflux disease without esophagitis: Secondary | ICD-10-CM

## 2023-07-30 NOTE — Progress Notes (Unsigned)
 Primary Care Physician:  Sheryle Carwin, MD Primary Gastroenterologist:  Dr. Shaaron  Pre-Procedure History & Physical: HPI:  Tammy Dyer is a 89 y.o. female here for follow up of rectal smearing incontinence.  Patient states with 2 g Questran  daily she had no bowel movement whatsoever.  Stopped.  Continue continued Benefiber 2 tablespoons daily 1 good bowel movement in the morning and then she smeared stool in her undergarments 3-4 times later in the day.  No out now diarrhea.  GERD well-controlled on omeprazole  20 mg daily.  Past Medical History:  Diagnosis Date   CRI (chronic renal insufficiency)    DM (diabetes mellitus) (HCC)    GERD (gastroesophageal reflux disease)    HTN (hypertension)    Hypercholesterolemia     Past Surgical History:  Procedure Laterality Date   ABDOMINAL HYSTERECTOMY     CATARACT EXTRACTION     CHOLECYSTECTOMY     COLONOSCOPY  2005   Rehman: diverticulosis, hemorrhoids, screening 2015   COLONOSCOPY WITH PROPOFOL  N/A 03/19/2020   Procedure: COLONOSCOPY WITH PROPOFOL ;  Surgeon: Cindie Carlin POUR, DO;  [facility];  Service: Endoscopy;  Laterality: N/A;   ESOPHAGOGASTRODUODENOSCOPY  10/22/06   normal esophagus/small hiatal hernia   ESOPHAGOGASTRODUODENOSCOPY (EGD) WITH PROPOFOL  N/A 03/19/2020   Procedure: ESOPHAGOGASTRODUODENOSCOPY (EGD) WITH PROPOFOL ;  Surgeon: Cindie Carlin POUR, DO;  [facility];  Service: Endoscopy;  Laterality: N/A;   HOT HEMOSTASIS  03/19/2020   Procedure: HOT HEMOSTASIS (ARGON PLASMA COAGULATION/BICAP);  Surgeon: Cindie Carlin POUR, DO;  [facility];  Service: Endoscopy;;   POLYPECTOMY  03/19/2020   Procedure: POLYPECTOMY;  Surgeon: Cindie Carlin POUR, DO;  [facility];  Service: Endoscopy;;  gastric polyp    Prior to Admission medications   Medication Sig Start Date End Date Taking? Authorizing Provider  acetaminophen  (TYLENOL ) 500 MG tablet Take 1,000 mg by mouth every 6 (six) hours as  needed for mild pain or moderate pain.   Yes [provider]  amLODipine  (NORVASC ) 5 MG tablet Take 5 mg by mouth daily. 06/11/23  Yes [provider]  atorvastatin  (LIPITOR) 20 MG tablet Take 20 mg by mouth every evening. 03/24/11  Yes [provider]  Ergocalciferol (VITAMIN D2) 50 MCG (2000 UT) TABS Take 1 tablet by mouth daily.   Yes [provider]  furosemide (LASIX) 20 MG tablet Take 20 mg by mouth every morning. 11/21/22  Yes [provider]  hydrALAZINE  (APRESOLINE ) 100 MG tablet Take 100 mg by mouth 3 (three) times daily. 04/22/20  Yes [provider]  LANTUS SOLOSTAR 100 UNIT/ML Solostar Pen Inject 20 Units into the skin every evening. 11/20/19  Yes [provider]  Multiple Vitamins-Minerals (PRESERVISION AREDS 2+MULTI VIT PO) Take 1 capsule by mouth in the morning and at bedtime.   Yes [provider]  omeprazole  (PRILOSEC) 20 MG capsule Take 1 capsule (20 mg total) by mouth daily. 04/19/15  Yes Anzley Dibbern, Lamar HERO, MD  sertraline  (ZOLOFT ) 100 MG tablet Take 100 mg by mouth daily. 03/31/11  Yes [provider]  trimethoprim (TRIMPEX) 100 MG tablet Take 100 mg by mouth daily.  03/27/14  Yes [provider]    Allergies as of 07/30/2023 - Review Complete 07/30/2023  Allergen Reaction Noted   Codeine  03/15/2010   Sulfonamide derivatives  03/15/2010    Family History  Problem Relation Age of Onset   Colon cancer Neg Hx     Social History   Socioeconomic  History   Marital status: Married    Spouse name: Not on file   Number of children: Not on file   Years of education: Not on file   Highest education level: Not on file  Occupational History   Not on file  Tobacco Use   Smoking status: Never   Smokeless tobacco: Never  Vaping Use   Vaping status: Never Used  Substance and Sexual Activity   Alcohol use: No   Drug use: No   Sexual activity: Not on file  Other Topics Concern   Not on file   Social History Narrative   Not on file   Social Drivers of Health   Financial Resource Strain: Not on file  Food Insecurity: Not on file  Transportation Needs: Not on file  Physical Activity: Not on file  Stress: Not on file  Social Connections: Not on file  Intimate Partner Violence: Not on file    Review of Systems: See HPI, otherwise negative ROS  Physical Exam: BP (!) 185/66 (BP Location: Right Arm, Patient Position: Sitting, Cuff Size: Normal)   Pulse 79   Temp 98.8 F (37.1 C) (Oral)   Ht 5' 1 (1.549 m)   Wt 137 lb (62.1 kg)   SpO2 98%   BMI 25.89 kg/m  General:   Alert,  Well-developed, well-nourished, pleasant and cooperative in NAD Abdomen: Non-distended, normal bowel sounds.  Soft and nontender without appreciable mass or hepatosplenomegaly.  Rectal: No external lesions digital exam reveals no rectal mucosal abnormality scant brown stool present.  Patient has a lax resting sphincter tone and is a 0 voluntary squeeze  Impression/Plan: 89 year old lady with fecal smearing incontinence she has very weak anorectal sphincteric mechanism.  Questran  caused constipation at low dose.  I do believe we can find an optimal dose with resin binder therapy to help with her symptoms. Giving her antimotility agents will likely do nothing but constipate.  GERD well-controlled on omeprazole  20 mg daily  Recommendations:  Resume Questran  at a low dose of 1 g daily not to be taken within 2 hours of other medications  Increase Benefiber to 1 tablespoon daily  Continue omeprazole  20 mg daily  Plan to recheck in 6 weeks.       Notice: This dictation was prepared with Dragon dictation along with smaller phrase technology. Any transcriptional errors that result from this process are unintentional and may not be corrected upon review.

## 2023-07-30 NOTE — Patient Instructions (Signed)
 It was good seeing you again today!  I believe cholestyramine  or Questran  will work for you we have to find the exact dose before you.  My recommendation is to resume but decrease Questran  dosing to 1 g orally daily not to be taken within 2 hours before other medications.  Also, decrease Benefiber to 1 tablespoon daily.  Continue omeprazole  20 mg daily-best taken 30 minutes before breakfast.  Office visit here in 6 weeks.

## 2023-08-28 ENCOUNTER — Other Ambulatory Visit (HOSPITAL_COMMUNITY): Payer: Self-pay | Admitting: Nephrology

## 2023-08-28 DIAGNOSIS — R809 Proteinuria, unspecified: Secondary | ICD-10-CM

## 2023-08-28 DIAGNOSIS — I129 Hypertensive chronic kidney disease with stage 1 through stage 4 chronic kidney disease, or unspecified chronic kidney disease: Secondary | ICD-10-CM | POA: Diagnosis not present

## 2023-08-28 DIAGNOSIS — E1122 Type 2 diabetes mellitus with diabetic chronic kidney disease: Secondary | ICD-10-CM | POA: Diagnosis not present

## 2023-08-28 DIAGNOSIS — D631 Anemia in chronic kidney disease: Secondary | ICD-10-CM | POA: Diagnosis not present

## 2023-08-28 DIAGNOSIS — N184 Chronic kidney disease, stage 4 (severe): Secondary | ICD-10-CM | POA: Diagnosis not present

## 2023-09-02 ENCOUNTER — Encounter (INDEPENDENT_AMBULATORY_CARE_PROVIDER_SITE_OTHER): Payer: PPO | Admitting: Ophthalmology

## 2023-09-02 DIAGNOSIS — Z7689 Persons encountering health services in other specified circumstances: Secondary | ICD-10-CM | POA: Diagnosis not present

## 2023-09-04 ENCOUNTER — Encounter (INDEPENDENT_AMBULATORY_CARE_PROVIDER_SITE_OTHER): Payer: PPO | Admitting: Ophthalmology

## 2023-09-06 ENCOUNTER — Encounter (INDEPENDENT_AMBULATORY_CARE_PROVIDER_SITE_OTHER): Admitting: Ophthalmology

## 2023-09-06 DIAGNOSIS — H353132 Nonexudative age-related macular degeneration, bilateral, intermediate dry stage: Secondary | ICD-10-CM

## 2023-09-06 DIAGNOSIS — E113393 Type 2 diabetes mellitus with moderate nonproliferative diabetic retinopathy without macular edema, bilateral: Secondary | ICD-10-CM | POA: Diagnosis not present

## 2023-09-06 DIAGNOSIS — H35033 Hypertensive retinopathy, bilateral: Secondary | ICD-10-CM

## 2023-09-06 DIAGNOSIS — Z794 Long term (current) use of insulin: Secondary | ICD-10-CM | POA: Diagnosis not present

## 2023-09-06 DIAGNOSIS — H43813 Vitreous degeneration, bilateral: Secondary | ICD-10-CM | POA: Diagnosis not present

## 2023-09-06 DIAGNOSIS — I10 Essential (primary) hypertension: Secondary | ICD-10-CM

## 2023-09-17 ENCOUNTER — Encounter: Payer: Self-pay | Admitting: Internal Medicine

## 2023-09-17 ENCOUNTER — Ambulatory Visit: Admitting: Internal Medicine

## 2023-09-17 VITALS — BP 211/68 | HR 80 | Temp 98.0°F | Ht 60.5 in | Wt 134.4 lb

## 2023-09-17 DIAGNOSIS — R151 Fecal smearing: Secondary | ICD-10-CM | POA: Diagnosis not present

## 2023-09-17 DIAGNOSIS — K219 Gastro-esophageal reflux disease without esophagitis: Secondary | ICD-10-CM | POA: Diagnosis not present

## 2023-09-17 NOTE — Patient Instructions (Signed)
 It was nice to see you again today!  Continue omeprazole  or Prilosec 20 mg daily best taken 30 minutes before meal  Continue 1/4 packet of Questran  daily.  Remember not to take within 2 hours before or after other medication.  Left-sided abdominal / chest pain under your breast is not likely coming from your GI tract.  Recommend return to his to see Dr. Sheryle regarding further evaluation  Unless something comes up, plan to see back in 1 year.

## 2023-09-17 NOTE — Progress Notes (Signed)
 Primary Care Physician:  Sheryle Carwin, MD Primary Gastroenterologist:  Dr. Shaaron  Pre-Procedure History & Physical: HPI:  Tammy Dyer is a 89 y.o. female here for follow-up GERD and fecal smearing.  One quarter of a 4 g packet of cholestyramine  daily has pretty much resolved fecal smearing having 1 bowel movement daily.  Omeprazole  20 mg daily has controlled GERD very well.  She does complain of radiating pain from her back under her left breast from time to time  - positional component;  has a history of slipped disc lower spine.  She does have a history of shingles but this was on the right side.  Past Medical History:  Diagnosis Date   CRI (chronic renal insufficiency)    DM (diabetes mellitus) (HCC)    GERD (gastroesophageal reflux disease)    HTN (hypertension)    Hypercholesterolemia     Past Surgical History:  Procedure Laterality Date   ABDOMINAL HYSTERECTOMY     CATARACT EXTRACTION     CHOLECYSTECTOMY     COLONOSCOPY  2005   Rehman: diverticulosis, hemorrhoids, screening 2015   COLONOSCOPY WITH PROPOFOL  N/A 03/19/2020   Procedure: COLONOSCOPY WITH PROPOFOL ;  Surgeon: Cindie Carlin POUR, DO;  [facility];  Service: Endoscopy;  Laterality: N/A;   ESOPHAGOGASTRODUODENOSCOPY  10/22/06   normal esophagus/small hiatal hernia   ESOPHAGOGASTRODUODENOSCOPY (EGD) WITH PROPOFOL  N/A 03/19/2020   Procedure: ESOPHAGOGASTRODUODENOSCOPY (EGD) WITH PROPOFOL ;  Surgeon: Cindie Carlin POUR, DO;  [facility];  Service: Endoscopy;  Laterality: N/A;   HOT HEMOSTASIS  03/19/2020   Procedure: HOT HEMOSTASIS (ARGON PLASMA COAGULATION/BICAP);  Surgeon: Cindie Carlin POUR, DO;  [facility];  Service: Endoscopy;;   POLYPECTOMY  03/19/2020   Procedure: POLYPECTOMY;  Surgeon: Cindie Carlin POUR, DO;  [facility];  Service: Endoscopy;;  gastric polyp    Prior to Admission medications   Medication Sig Start Date End Date Taking? Authorizing Provider   acetaminophen  (TYLENOL ) 500 MG tablet Take 1,000 mg by mouth every 6 (six) hours as needed for mild pain or moderate pain.   Yes [provider]  amLODipine  (NORVASC ) 5 MG tablet Take 5 mg by mouth daily. 06/11/23  Yes [provider]  atorvastatin  (LIPITOR) 20 MG tablet Take 20 mg by mouth every evening. 03/24/11  Yes [provider]  colestipol (COLESTID) 5 g (1 g) Take 1 g by mouth daily.   Yes [provider]  EMBECTA PEN NEEDLE NANO 2 GEN 32G X 4 MM MISC daily. 08/27/23  Yes [provider]  Ergocalciferol (VITAMIN D2) 50 MCG (2000 UT) TABS Take 1 tablet by mouth daily.   Yes [provider]  furosemide (LASIX) 40 MG tablet Take 40 mg by mouth every morning. 11/21/22  Yes [provider]  hydrALAZINE  (APRESOLINE ) 100 MG tablet Take 100 mg by mouth 3 (three) times daily. 04/22/20  Yes [provider]  LANTUS SOLOSTAR 100 UNIT/ML Solostar Pen Inject 20 Units into the skin every evening. 11/20/19  Yes [provider]  Multiple Vitamins-Minerals (PRESERVISION AREDS 2+MULTI VIT PO) Take 1 capsule by mouth in the morning and at bedtime.   Yes [provider]  omeprazole  (PRILOSEC) 20 MG capsule Take 1 capsule (20 mg total) by mouth daily. 04/19/15  Yes Taro Hidrogo, Lamar HERO, MD  sertraline  (ZOLOFT ) 100 MG tablet Take 100 mg by mouth daily. 03/31/11  Yes [provider]  spironolactone (ALDACTONE) 25 MG tablet Take 12.5 mg by mouth daily. 08/28/23  Yes [provider]  trimethoprim (TRIMPEX) 100 MG tablet Take 100 mg by mouth daily.  03/27/14  Yes [provider]    Allergies as of 09/17/2023 - Review Complete 09/17/2023  Allergen Reaction Noted   Codeine  03/15/2010   Sulfonamide derivatives  03/15/2010    Family History  Problem Relation Age of Onset   Colon cancer Neg Hx     Social History   Socioeconomic History   Marital status: Married    Spouse name: Not on file   Number of  children: Not on file   Years of education: Not on file   Highest education level: Not on file  Occupational History   Not on file  Tobacco Use   Smoking status: Never   Smokeless tobacco: Never  Vaping Use   Vaping status: Never Used  Substance and Sexual Activity   Alcohol use: No   Drug use: No   Sexual activity: Not on file  Other Topics Concern   Not on file  Social History Narrative   Not on file   Social Drivers of Health   Financial Resource Strain: Not on file  Food Insecurity: Not on file  Transportation Needs: Not on file  Physical Activity: Not on file  Stress: Not on file  Social Connections: Not on file  Intimate Partner Violence: Not on file    Review of Systems: See HPI, otherwise negative ROS  Physical Exam: BP (!) 211/68 (BP Location: Left Arm, Patient Position: Sitting, Cuff Size: Normal)   Pulse 80   Temp 98 F (36.7 C) (Oral)   Ht 5' 0.5 (1.537 m)   Wt 134 lb 6.4 oz (61 kg)   SpO2 97%   BMI 25.82 kg/m  General:   Alert,  Well-developed, well-nourished, pleasant and cooperative in NAD Neck:  Supple; no masses or thyromegaly. No significant cervical adenopathy.  Lungs:  Clear throughout to auscultation.   No wheezes, crackles, or rhonchi. No acute distress. Heart:  Regular rate and rhythm; no murmurs, clicks, rubs,  or gallops. Abdomen: Non-distended, normal bowel sounds.  Soft and nontender without appreciable mass or hepatosplenomegaly.  No radicular rash identified.  Impression/Plan: 89 year old lady with fecal smearing resolved with low-dose Questran  daily.  GERD well-controlled on omeprazole  20 mg daily.  No alarm GI symptoms.  She does have radicular type pain under her left breast that radiates around from her back.  This does not appear to be emanating from her GI tract.  Recommendations:  Continue omeprazole  or Prilosec 20 mg daily best taken 30 minutes before meal  Continue 1/4 packet of Questran  daily.  Remember not to take  within 2 hours before or after other medication.  Left-sided abdominal / chest pain under your breast is not likely coming from your GI tract.  Recommend return to his to see Dr. Sheryle regarding further evaluation  Unless something comes up, plan to see back in 1 year.   Notice: This dictation was prepared with Dragon dictation along with smaller phrase technology. Any transcriptional errors that result from this process are unintentional and may not be corrected upon review.

## 2023-09-20 ENCOUNTER — Ambulatory Visit (HOSPITAL_COMMUNITY)
Admission: RE | Admit: 2023-09-20 | Discharge: 2023-09-20 | Disposition: A | Discharge status: Home / Self Care | Source: Ambulatory Visit | Attending: Nephrology | Admitting: Nephrology

## 2023-09-20 DIAGNOSIS — N184 Chronic kidney disease, stage 4 (severe): Secondary | ICD-10-CM | POA: Insufficient documentation

## 2023-09-20 DIAGNOSIS — I129 Hypertensive chronic kidney disease with stage 1 through stage 4 chronic kidney disease, or unspecified chronic kidney disease: Secondary | ICD-10-CM | POA: Insufficient documentation

## 2023-09-20 DIAGNOSIS — R809 Proteinuria, unspecified: Secondary | ICD-10-CM | POA: Diagnosis not present

## 2023-09-25 DIAGNOSIS — N184 Chronic kidney disease, stage 4 (severe): Secondary | ICD-10-CM | POA: Diagnosis not present

## 2023-09-25 DIAGNOSIS — E1129 Type 2 diabetes mellitus with other diabetic kidney complication: Secondary | ICD-10-CM | POA: Diagnosis not present

## 2023-09-25 DIAGNOSIS — N049 Nephrotic syndrome with unspecified morphologic changes: Secondary | ICD-10-CM | POA: Diagnosis not present

## 2023-09-25 DIAGNOSIS — N179 Acute kidney failure, unspecified: Secondary | ICD-10-CM | POA: Diagnosis not present

## 2023-10-03 DIAGNOSIS — D631 Anemia in chronic kidney disease: Secondary | ICD-10-CM | POA: Diagnosis not present

## 2023-10-03 DIAGNOSIS — R809 Proteinuria, unspecified: Secondary | ICD-10-CM | POA: Diagnosis not present

## 2023-10-03 DIAGNOSIS — N184 Chronic kidney disease, stage 4 (severe): Secondary | ICD-10-CM | POA: Diagnosis not present

## 2023-10-03 DIAGNOSIS — N185 Chronic kidney disease, stage 5: Secondary | ICD-10-CM | POA: Diagnosis not present

## 2023-10-03 DIAGNOSIS — N189 Chronic kidney disease, unspecified: Secondary | ICD-10-CM | POA: Diagnosis not present

## 2023-10-09 DIAGNOSIS — D631 Anemia in chronic kidney disease: Secondary | ICD-10-CM | POA: Diagnosis not present

## 2023-10-09 DIAGNOSIS — N184 Chronic kidney disease, stage 4 (severe): Secondary | ICD-10-CM | POA: Diagnosis not present

## 2023-10-09 DIAGNOSIS — E1129 Type 2 diabetes mellitus with other diabetic kidney complication: Secondary | ICD-10-CM | POA: Diagnosis not present

## 2023-10-09 DIAGNOSIS — N179 Acute kidney failure, unspecified: Secondary | ICD-10-CM | POA: Diagnosis not present

## 2023-11-14 DIAGNOSIS — R809 Proteinuria, unspecified: Secondary | ICD-10-CM | POA: Diagnosis not present

## 2023-11-14 DIAGNOSIS — D631 Anemia in chronic kidney disease: Secondary | ICD-10-CM | POA: Diagnosis not present

## 2023-11-14 DIAGNOSIS — D649 Anemia, unspecified: Secondary | ICD-10-CM | POA: Diagnosis not present

## 2023-11-14 DIAGNOSIS — N185 Chronic kidney disease, stage 5: Secondary | ICD-10-CM | POA: Diagnosis not present

## 2023-11-14 DIAGNOSIS — N189 Chronic kidney disease, unspecified: Secondary | ICD-10-CM | POA: Diagnosis not present

## 2023-11-14 DIAGNOSIS — N184 Chronic kidney disease, stage 4 (severe): Secondary | ICD-10-CM | POA: Diagnosis not present

## 2023-11-20 DIAGNOSIS — D631 Anemia in chronic kidney disease: Secondary | ICD-10-CM | POA: Diagnosis not present

## 2023-11-20 DIAGNOSIS — N185 Chronic kidney disease, stage 5: Secondary | ICD-10-CM | POA: Diagnosis not present

## 2023-11-20 DIAGNOSIS — N184 Chronic kidney disease, stage 4 (severe): Secondary | ICD-10-CM | POA: Diagnosis not present

## 2023-11-20 DIAGNOSIS — N179 Acute kidney failure, unspecified: Secondary | ICD-10-CM | POA: Diagnosis not present

## 2023-11-20 DIAGNOSIS — E1122 Type 2 diabetes mellitus with diabetic chronic kidney disease: Secondary | ICD-10-CM | POA: Diagnosis not present

## 2023-11-23 DIAGNOSIS — N186 End stage renal disease: Secondary | ICD-10-CM | POA: Diagnosis not present

## 2023-11-23 DIAGNOSIS — Z992 Dependence on renal dialysis: Secondary | ICD-10-CM | POA: Diagnosis not present

## 2023-11-26 ENCOUNTER — Encounter (HOSPITAL_COMMUNITY): Payer: Self-pay

## 2023-11-27 ENCOUNTER — Ambulatory Visit (HOSPITAL_BASED_OUTPATIENT_CLINIC_OR_DEPARTMENT_OTHER)

## 2023-11-27 ENCOUNTER — Other Ambulatory Visit: Payer: Self-pay

## 2023-11-27 ENCOUNTER — Encounter (HOSPITAL_COMMUNITY)
Admission: RE | Disposition: A | Discharge status: Home / Self Care | Payer: Self-pay | Source: Home / Self Care | Attending: Vascular Surgery

## 2023-11-27 ENCOUNTER — Other Ambulatory Visit: Payer: Self-pay | Admitting: *Deleted

## 2023-11-27 ENCOUNTER — Ambulatory Visit (HOSPITAL_COMMUNITY)
Admission: RE | Admit: 2023-11-27 | Discharge: 2023-11-27 | Disposition: A | Discharge status: Home / Self Care | Attending: Vascular Surgery | Admitting: Vascular Surgery

## 2023-11-27 DIAGNOSIS — N186 End stage renal disease: Secondary | ICD-10-CM

## 2023-11-27 DIAGNOSIS — I120 Hypertensive chronic kidney disease with stage 5 chronic kidney disease or end stage renal disease: Secondary | ICD-10-CM | POA: Insufficient documentation

## 2023-11-27 LAB — GLUCOSE, CAPILLARY: Glucose-Capillary: 171 mg/dL — ABNORMAL HIGH (ref 70–99)

## 2023-11-27 SURGERY — DIALYSIS/PERMA CATHETER INSERTION
Anesthesia: LOCAL | Site: Chest

## 2023-11-27 MED ORDER — HEPARIN (PORCINE) IN NACL 1000-0.9 UT/500ML-% IV SOLN
INTRAVENOUS | Status: DC | PRN
  Administered 2023-11-27: 500 mL

## 2023-11-27 MED ORDER — LIDOCAINE HCL (PF) 1 % IJ SOLN
INTRAMUSCULAR | Status: AC
  Filled 2023-11-27: qty 30

## 2023-11-27 MED ORDER — LIDOCAINE HCL (PF) 1 % IJ SOLN
INTRAMUSCULAR | Status: DC | PRN
  Administered 2023-11-27: 10 mL
  Administered 2023-11-27 (×2): 5 mL

## 2023-11-27 MED ORDER — HEPARIN SODIUM (PORCINE) 1000 UNIT/ML IJ SOLN
INTRAMUSCULAR | Status: AC
  Filled 2023-11-27: qty 10

## 2023-11-27 MED ORDER — MIDAZOLAM HCL (PF) 2 MG/2ML IJ SOLN
INTRAMUSCULAR | Status: DC | PRN
  Administered 2023-11-27: .5 mg via INTRAVENOUS

## 2023-11-27 MED ORDER — LIDOCAINE-EPINEPHRINE 1 %-1:100000 IJ SOLN
INTRAMUSCULAR | Status: AC
  Filled 2023-11-27: qty 1

## 2023-11-27 MED ORDER — MIDAZOLAM HCL 2 MG/2ML IJ SOLN
INTRAMUSCULAR | Status: AC
  Filled 2023-11-27: qty 2

## 2023-11-27 MED ORDER — FENTANYL CITRATE (PF) 100 MCG/2ML IJ SOLN
INTRAMUSCULAR | Status: AC
  Filled 2023-11-27: qty 2

## 2023-11-27 MED ORDER — HEPARIN SODIUM (PORCINE) 1000 UNIT/ML IJ SOLN
INTRAMUSCULAR | Status: DC | PRN
  Administered 2023-11-27 (×2): 1600 [IU] via INTRAVENOUS

## 2023-11-27 MED ORDER — FENTANYL CITRATE (PF) 100 MCG/2ML IJ SOLN
INTRAMUSCULAR | Status: DC | PRN
  Administered 2023-11-27: 25 ug via INTRAVENOUS

## 2023-11-27 SURGICAL SUPPLY — 5 items
CATH PALINDROME-P 19CM W/VT (CATHETERS) IMPLANT
KIT MICROPUNCTURE NIT STIFF (SHEATH) IMPLANT
MAT PREVALON FULL STRYKER (MISCELLANEOUS) IMPLANT
SHEATH PROBE COVER 6X72 (BAG) IMPLANT
TRAY PV CATH (CUSTOM PROCEDURE TRAY) ×1 IMPLANT

## 2023-11-27 NOTE — Op Note (Signed)
    NAME: Tammy Dyer    MRN: [id number] DOB: 01-Jul-1933    DATE OF OPERATION: 11/27/2023  PREOP DIAGNOSIS:    End-stage renal disease requiring dialysis  POSTOP DIAGNOSIS:    Same  PROCEDURE:    19 cm palindrome tunneled dialysis catheter placement in the right internal jugular vein  SURGEON: Fonda FORBES Rim  ASSIST: None  ANESTHESIA: Moderate  EBL: 5 mL  INDICATIONS:    Tammy Dyer is a 89 y.o. female with end-stage renal disease now requiring dialysis.  We had a nice discussion regarding common dialysis catheter placement.  After discussing risks and benefits, Tammy Dyer elected to proceed  FINDINGS:   19 cm palindrome tunneled dialysis catheter placed at the cavoatrial junction  TECHNIQUE:   Using ultrasound guidance the right internal jugular vein was accessed with micropuncture technique.  Through the micropuncture sheath, half milligram of Versed and 50 mcg of fentanyl were administered.  A floppy J-wire was advanced into the superior vena cava and down into the inferior vena cava.  A small incision was made around the skin access point after anesthetizing the area with local anesthetic.  The access point was serially dilated under direct fluoroscopic guidance.  A peel-away sheath was introduced into the superior vena cava under fluoroscopic guidance.  The local anesthetic was used along the tunnel tract.  A counterincision was made in the chest under the clavicle.  A 19 cm tunneled dialysis catheter was then tunneled under the skin, over the clavicle into the incision in the neck.    The tunneling device was removed and the catheter fed through the peel-away sheath into the superior vena cava over the wire.  The peel-away sheath was removed. Adequate position was confirmed with x-ray, then the wire was removed.  The catheter was tested and found to flush and draw back well.  Catheter was heparin locked.  Caps were applied.  Catheter was sutured to the skin.  The neck  incision was closed with 4-0 Monocryl and dermabond.   Fonda FORBES Rim, MD [facility] DATE OF DICTATION:   11/27/2023

## 2023-11-27 NOTE — H&P (Signed)
 Office Note     CC: End stage disease and need for long-term HD access Requesting Provider:  No ref. provider found  HPI: Tammy Dyer is a 89 y.o. (1933/07/10) female presenting at the request of .Sheryle Carwin, MD with end-stage renal disease in need of long-term HD access.  On exam, Tammy Dyer was doing well, accompanied by her husband.  [place] are not as of [place] .  She has had decline in kidney function, and now requires dialysis.  She was sent today for discussion regarding tunneled dialysis catheter placement.  No prior history of TDC.    Past Medical History:  Diagnosis Date   CRI (chronic renal insufficiency)    DM (diabetes mellitus) (HCC)    GERD (gastroesophageal reflux disease)    HTN (hypertension)    Hypercholesterolemia     Past Surgical History:  Procedure Laterality Date   ABDOMINAL HYSTERECTOMY     CATARACT EXTRACTION     CHOLECYSTECTOMY     COLONOSCOPY  2005   Rehman: diverticulosis, hemorrhoids, screening 2015   COLONOSCOPY WITH PROPOFOL  N/A 03/19/2020   Procedure: COLONOSCOPY WITH PROPOFOL ;  Surgeon: Cindie Carlin POUR, DO;  [facility];  Service: Endoscopy;  Laterality: N/A;   ESOPHAGOGASTRODUODENOSCOPY  10/22/06   normal esophagus/small hiatal hernia   ESOPHAGOGASTRODUODENOSCOPY (EGD) WITH PROPOFOL  N/A 03/19/2020   Procedure: ESOPHAGOGASTRODUODENOSCOPY (EGD) WITH PROPOFOL ;  Surgeon: Cindie Carlin POUR, DO;  [facility];  Service: Endoscopy;  Laterality: N/A;   HOT HEMOSTASIS  03/19/2020   Procedure: HOT HEMOSTASIS (ARGON PLASMA COAGULATION/BICAP);  Surgeon: Cindie Carlin POUR, DO;  [facility];  Service: Endoscopy;;   POLYPECTOMY  03/19/2020   Procedure: POLYPECTOMY;  Surgeon: Cindie Carlin POUR, DO;  [facility];  Service: Endoscopy;;  gastric polyp    Social History   Socioeconomic History   Marital status: Widowed    Spouse name: Not on file   Number of children: Not on file   Years of  education: Not on file   Highest education level: Not on file  Occupational History   Not on file  Tobacco Use   Smoking status: Never   Smokeless tobacco: Never  Vaping Use   Vaping status: Never Used  Substance and Sexual Activity   Alcohol use: No   Drug use: No   Sexual activity: Not on file  Other Topics Concern   Not on file  Social History Narrative   Not on file   Social Drivers of Health   Financial Resource Strain: Not on file  Food Insecurity: Not on file  Transportation Needs: Not on file  Physical Activity: Not on file  Stress: Not on file  Social Connections: Not on file  Intimate Partner Violence: Not on file   Family History  Problem Relation Age of Onset   Colon cancer Neg Hx     Current Facility-Administered Medications  Medication Dose Route Frequency Provider Last Rate Last Admin   fentaNYL (SUBLIMAZE) injection    PRN Veronda Gabor E, MD   25 mcg at 11/27/23 1407   Heparin (Porcine) in NaCl 1000-0.9 UT/500ML-% SOLN    PRN Faline Langer E, MD   500 mL at 11/27/23 1411   heparin sodium (porcine) injection    PRN Terika Pillard E, MD   1,600 Units at 11/27/23 1414   lidocaine (PF) (XYLOCAINE) 1 % injection    PRN Nickcole Bralley E, MD   5 mL at 11/27/23 1410   midazolam PF (VERSED)  injection    PRN Kairi Harshbarger E, MD   0.5 mg at 11/27/23 1406    Allergies  Allergen Reactions   Codeine     REACTION: head ache   Sulfonamide Derivatives     REACTION: yeast infection     REVIEW OF SYSTEMS:  [X]  denotes positive finding, [ ]  denotes negative finding Cardiac  Comments:  Chest pain or chest pressure:    Shortness of breath upon exertion:    Short of breath when lying flat:    Irregular heart rhythm:        Vascular    Pain in calf, thigh, or hip brought on by ambulation:    Pain in feet at night that wakes you up from your sleep:     Blood clot in your veins:    Leg swelling:         Pulmonary    Oxygen at home:    Productive cough:      Wheezing:         Neurologic    Sudden weakness in arms or legs:     Sudden numbness in arms or legs:     Sudden onset of difficulty speaking or slurred speech:    Temporary loss of vision in one eye:     Problems with dizziness:         Gastrointestinal    Blood in stool:     Vomited blood:         Genitourinary    Burning when urinating:     Blood in urine:        Psychiatric    Major depression:         Hematologic    Bleeding problems:    Problems with blood clotting too easily:        Skin    Rashes or ulcers:        Constitutional    Fever or chills:      PHYSICAL EXAMINATION:  Vitals:   11/27/23 1133 11/27/23 1149  BP: (!) 245/80 (!) 229/75  Pulse: 78 72  Resp: 12 16  Temp: 97.9 F (36.6 C)   TempSrc: Oral   SpO2: 99% 99%    General:  WDWN in NAD; vital signs documented above Gait: Not observed HENT: WNL, normocephalic Pulmonary: normal non-labored breathing , without wheezing Cardiac: regular HR Abdomen: soft, NT, no masses Skin: without rashes Vascular Exam/Pulses:  Right Left  Radial 2+ (normal) 2+ (normal)                       Extremities: without ischemic changes, without Gangrene , without cellulitis; without open wounds;  Musculoskeletal: no muscle wasting or atrophy  Neurologic: A&O X 3;  No focal weakness or paresthesias are detected Psychiatric:  The pt has Normal affect.     ASSESSMENT/PLAN: Tammy Dyer is a 89 y.o. female presenting with end-stage renal disease now advanced to the point of needing dialysis.  I think it is reasonable to move forward with tunneled dialysis catheter placement.  After discussing the risks and benefits of tunneled dialysis catheter placement, she only elected to proceed.   Fonda FORBES Rim, MD [facility] [phone]

## 2023-11-28 ENCOUNTER — Encounter (HOSPITAL_COMMUNITY): Payer: Self-pay | Admitting: Vascular Surgery

## 2023-11-28 DIAGNOSIS — Z992 Dependence on renal dialysis: Secondary | ICD-10-CM | POA: Diagnosis not present

## 2023-11-28 DIAGNOSIS — N2581 Secondary hyperparathyroidism of renal origin: Secondary | ICD-10-CM | POA: Diagnosis not present

## 2023-11-28 DIAGNOSIS — E039 Hypothyroidism, unspecified: Secondary | ICD-10-CM | POA: Diagnosis not present

## 2023-11-28 DIAGNOSIS — Z111 Encounter for screening for respiratory tuberculosis: Secondary | ICD-10-CM | POA: Diagnosis not present

## 2023-11-28 DIAGNOSIS — D689 Coagulation defect, unspecified: Secondary | ICD-10-CM | POA: Diagnosis not present

## 2023-12-03 DIAGNOSIS — N2581 Secondary hyperparathyroidism of renal origin: Secondary | ICD-10-CM | POA: Diagnosis not present

## 2023-12-03 DIAGNOSIS — D509 Iron deficiency anemia, unspecified: Secondary | ICD-10-CM | POA: Diagnosis not present

## 2023-12-03 DIAGNOSIS — D689 Coagulation defect, unspecified: Secondary | ICD-10-CM | POA: Diagnosis not present

## 2023-12-03 DIAGNOSIS — Z992 Dependence on renal dialysis: Secondary | ICD-10-CM | POA: Diagnosis not present

## 2023-12-10 DIAGNOSIS — D509 Iron deficiency anemia, unspecified: Secondary | ICD-10-CM | POA: Diagnosis not present

## 2023-12-10 DIAGNOSIS — N2581 Secondary hyperparathyroidism of renal origin: Secondary | ICD-10-CM | POA: Diagnosis not present

## 2023-12-10 DIAGNOSIS — D689 Coagulation defect, unspecified: Secondary | ICD-10-CM | POA: Diagnosis not present

## 2023-12-10 DIAGNOSIS — Z992 Dependence on renal dialysis: Secondary | ICD-10-CM | POA: Diagnosis not present

## 2023-12-16 DIAGNOSIS — N186 End stage renal disease: Secondary | ICD-10-CM | POA: Diagnosis not present

## 2023-12-16 DIAGNOSIS — Z992 Dependence on renal dialysis: Secondary | ICD-10-CM | POA: Diagnosis not present

## 2023-12-16 DIAGNOSIS — N2581 Secondary hyperparathyroidism of renal origin: Secondary | ICD-10-CM | POA: Diagnosis not present

## 2023-12-16 DIAGNOSIS — D509 Iron deficiency anemia, unspecified: Secondary | ICD-10-CM | POA: Diagnosis not present

## 2023-12-16 DIAGNOSIS — D689 Coagulation defect, unspecified: Secondary | ICD-10-CM | POA: Diagnosis not present

## 2023-12-24 DIAGNOSIS — N186 End stage renal disease: Secondary | ICD-10-CM | POA: Diagnosis not present

## 2023-12-24 DIAGNOSIS — Z992 Dependence on renal dialysis: Secondary | ICD-10-CM | POA: Diagnosis not present

## 2023-12-24 DIAGNOSIS — D689 Coagulation defect, unspecified: Secondary | ICD-10-CM | POA: Diagnosis not present

## 2023-12-24 DIAGNOSIS — D509 Iron deficiency anemia, unspecified: Secondary | ICD-10-CM | POA: Diagnosis not present

## 2023-12-24 DIAGNOSIS — N2581 Secondary hyperparathyroidism of renal origin: Secondary | ICD-10-CM | POA: Diagnosis not present

## 2024-02-21 ENCOUNTER — Encounter (INDEPENDENT_AMBULATORY_CARE_PROVIDER_SITE_OTHER): Admitting: Ophthalmology

## 2024-02-21 ENCOUNTER — Emergency Department (HOSPITAL_COMMUNITY)
Admission: EM | Admit: 2024-02-21 | Discharge: 2024-02-22 | Disposition: A | Attending: Emergency Medicine | Admitting: Emergency Medicine

## 2024-02-21 ENCOUNTER — Encounter (HOSPITAL_COMMUNITY): Payer: Self-pay | Admitting: Emergency Medicine

## 2024-02-21 ENCOUNTER — Other Ambulatory Visit: Payer: Self-pay

## 2024-02-21 ENCOUNTER — Emergency Department (HOSPITAL_COMMUNITY)

## 2024-02-21 DIAGNOSIS — R531 Weakness: Secondary | ICD-10-CM | POA: Insufficient documentation

## 2024-02-21 DIAGNOSIS — R0602 Shortness of breath: Secondary | ICD-10-CM | POA: Insufficient documentation

## 2024-02-21 DIAGNOSIS — Z794 Long term (current) use of insulin: Secondary | ICD-10-CM | POA: Diagnosis not present

## 2024-02-21 DIAGNOSIS — Z992 Dependence on renal dialysis: Secondary | ICD-10-CM | POA: Diagnosis present

## 2024-02-21 DIAGNOSIS — Z91158 Patient's noncompliance with renal dialysis for other reason: Secondary | ICD-10-CM

## 2024-02-21 LAB — COMPREHENSIVE METABOLIC PANEL WITH GFR
ALT: 18 U/L (ref 0–44)
AST: 28 U/L (ref 15–41)
Albumin: 4.2 g/dL (ref 3.5–5.0)
Alkaline Phosphatase: 128 U/L — ABNORMAL HIGH (ref 38–126)
Anion gap: 18 — ABNORMAL HIGH (ref 5–15)
BUN: 74 mg/dL — ABNORMAL HIGH (ref 8–23)
CO2: 23 mmol/L (ref 22–32)
Calcium: 9.9 mg/dL (ref 8.9–10.3)
Chloride: 98 mmol/L (ref 98–111)
Creatinine, Ser: 5.67 mg/dL — ABNORMAL HIGH (ref 0.44–1.00)
GFR, Estimated: 7 mL/min — ABNORMAL LOW
Glucose, Bld: 192 mg/dL — ABNORMAL HIGH (ref 70–99)
Potassium: 3.9 mmol/L (ref 3.5–5.1)
Sodium: 139 mmol/L (ref 135–145)
Total Bilirubin: 0.6 mg/dL (ref 0.0–1.2)
Total Protein: 6.9 g/dL (ref 6.5–8.1)

## 2024-02-21 LAB — CBC WITH DIFFERENTIAL/PLATELET
Abs Immature Granulocytes: 0.02 10*3/uL (ref 0.00–0.07)
Basophils Absolute: 0 10*3/uL (ref 0.0–0.1)
Basophils Relative: 0 %
Eosinophils Absolute: 0.2 10*3/uL (ref 0.0–0.5)
Eosinophils Relative: 3 %
HCT: 33 % — ABNORMAL LOW (ref 36.0–46.0)
Hemoglobin: 10.6 g/dL — ABNORMAL LOW (ref 12.0–15.0)
Immature Granulocytes: 0 %
Lymphocytes Relative: 17 %
Lymphs Abs: 1 10*3/uL (ref 0.7–4.0)
MCH: 31.5 pg (ref 26.0–34.0)
MCHC: 32.1 g/dL (ref 30.0–36.0)
MCV: 97.9 fL (ref 80.0–100.0)
Monocytes Absolute: 0.4 10*3/uL (ref 0.1–1.0)
Monocytes Relative: 7 %
Neutro Abs: 4.1 10*3/uL (ref 1.7–7.7)
Neutrophils Relative %: 73 %
Platelets: 180 10*3/uL (ref 150–400)
RBC: 3.37 MIL/uL — ABNORMAL LOW (ref 3.87–5.11)
RDW: 15.9 % — ABNORMAL HIGH (ref 11.5–15.5)
WBC: 5.7 10*3/uL (ref 4.0–10.5)
nRBC: 0 % (ref 0.0–0.2)

## 2024-02-21 LAB — URINALYSIS, ROUTINE W REFLEX MICROSCOPIC
Bacteria, UA: NONE SEEN
Bilirubin Urine: NEGATIVE
Glucose, UA: >=500 mg/dL — AB
Hgb urine dipstick: NEGATIVE
Ketones, ur: NEGATIVE mg/dL
Leukocytes,Ua: NEGATIVE
Nitrite: NEGATIVE
Protein, ur: >=300 mg/dL — AB
Specific Gravity, Urine: 1.013 (ref 1.005–1.030)
pH: 7 (ref 5.0–8.0)

## 2024-02-21 LAB — CBG MONITORING, ED: Glucose-Capillary: 187 mg/dL — ABNORMAL HIGH (ref 70–99)

## 2024-02-21 MED ORDER — CHLORHEXIDINE GLUCONATE CLOTH 2 % EX PADS: 6.0000 | MEDICATED_PAD | Freq: Every day | CUTANEOUS | Status: DC

## 2024-02-21 MED ORDER — LIDOCAINE HCL (PF) 1 % IJ SOLN: 5.0000 mL | INTRAMUSCULAR | Status: DC | PRN

## 2024-02-21 MED ORDER — ALTEPLASE 2 MG IJ SOLR: 2.0000 mg | Freq: Once | INTRAMUSCULAR | Status: DC | PRN

## 2024-02-21 MED ORDER — AMLODIPINE BESYLATE 5 MG PO TABS
5.0000 mg | ORAL_TABLET | Freq: Once | ORAL | Status: AC
  Administered 2024-02-21: 5 mg via ORAL
  Filled 2024-02-21: qty 1

## 2024-02-21 MED ORDER — HEPARIN SODIUM (PORCINE) 1000 UNIT/ML IJ SOLN
INTRAMUSCULAR | Status: AC
  Filled 2024-02-21: qty 4

## 2024-02-21 MED ORDER — ALBUMIN HUMAN 25 % IV SOLN
INTRAVENOUS | Status: AC
  Filled 2024-02-21: qty 100

## 2024-02-21 MED ORDER — ALBUMIN HUMAN 25 % IV SOLN
25.0000 g | Freq: Once | INTRAVENOUS | Status: AC
  Administered 2024-02-21: 25 g via INTRAVENOUS

## 2024-02-21 MED ORDER — HEPARIN SODIUM (PORCINE) 1000 UNIT/ML DIALYSIS
1000.0000 [IU] | INTRAMUSCULAR | Status: DC | PRN
  Administered 2024-02-21: 3200 [IU]

## 2024-02-21 MED ORDER — ACETAMINOPHEN 325 MG PO TABS
650.0000 mg | ORAL_TABLET | Freq: Once | ORAL | Status: AC
  Administered 2024-02-21: 650 mg via ORAL
  Filled 2024-02-21: qty 2

## 2024-02-21 MED ORDER — MIDODRINE HCL 5 MG PO TABS
10.0000 mg | ORAL_TABLET | Freq: Once | ORAL | Status: AC | PRN
  Administered 2024-02-21: 10 mg via ORAL

## 2024-02-21 MED ORDER — MIDODRINE HCL 5 MG PO TABS
ORAL_TABLET | ORAL | Status: AC
  Filled 2024-02-21: qty 2

## 2024-02-21 MED ORDER — FUROSEMIDE 40 MG PO TABS
40.0000 mg | ORAL_TABLET | Freq: Once | ORAL | Status: AC
  Administered 2024-02-21: 40 mg via ORAL
  Filled 2024-02-21: qty 1

## 2024-02-21 MED ORDER — LIDOCAINE-PRILOCAINE 2.5-2.5 % EX CREA: 1.0000 | TOPICAL_CREAM | CUTANEOUS | Status: DC | PRN

## 2024-02-21 MED ORDER — ANTICOAGULANT SODIUM CITRATE 4% (200MG/5ML) IV SOLN: 5.0000 mL | Status: DC | PRN

## 2024-02-21 MED ORDER — SPIRONOLACTONE 12.5 MG HALF TABLET
12.5000 mg | ORAL_TABLET | Freq: Once | ORAL | Status: AC
  Administered 2024-02-21: 12.5 mg via ORAL
  Filled 2024-02-21: qty 1

## 2024-02-21 MED ORDER — PENTAFLUOROPROP-TETRAFLUOROETH EX AERO: 1.0000 | INHALATION_SPRAY | CUTANEOUS | Status: DC | PRN

## 2024-02-21 NOTE — Progress Notes (Signed)
 Received patient in stretcher to unit Alert and oriented x4 Informed consent signed and in chart.   TX duration:3 hours  Patient had hypotensive episode 100 cc bolus. @ 1015 another hypotensive episode 88/37, decrease loc, black stare, n/v. HOB elevated upright.  Rapid called. 1017 rapid team arrived pt became more responsive and alert. Nephrology alerted and ordered meds albumin  25g PIv and midodrin 10mg  po.   And to stop UF.  Transported back to ED. Alert, without acute distress.  Hand-off given to patient's nurse.   Access used: HD cath Access issues: high arterial pressure alarm , Line reversed.   Total UF removed: Medication(s) given: albhumin 25g 100ml, midodrin 10 mg po, heplock 1.6 units per port Post HD VS: see table below Post HD weight: unable to weight pot.    02/21/24 2326  Vitals  Temp 98.3 F (36.8 C)  Temp Source Oral  BP (!) 183/76  MAP (mmHg) 104  BP Location Right Arm  BP Method Automatic  Patient Position (if appropriate) Lying  Pulse Rate 83  Pulse Rate Source Monitor  ECG Heart Rate 83  Resp (!) 21  Oxygen Therapy  SpO2 99 %  O2 Device Room Air  Patient Activity (if Appropriate) In bed  Pulse Oximetry Type Continuous  During Treatment Monitoring  Blood Flow Rate (mL/min) 0 mL/min  Arterial Pressure (mmHg) 7.88 mmHg  Venous Pressure (mmHg) 20.6 mmHg  TMP (mmHg) -26.06 mmHg  Ultrafiltration Rate (mL/min) 0 mL/min  Dialysate Flow Rate (mL/min) 299 ml/min  Dialysate Potassium Concentration 3  Dialysate Calcium  Concentration 2.5  Duration of HD Treatment -hour(s) 3 hour(s)  Cumulative Fluid Removed (mL) per Treatment  707.77  HD Safety Checks Performed Yes  Intra-Hemodialysis Comments Tolerated well  Post Treatment  Dialyzer Clearance Lightly streaked  Hemodialysis Intake (mL) 300 mL  Liters Processed 62.9  Fluid Removed (mL) 700 mL  Tolerated HD Treatment No (Comment)  Post-Hemodialysis Comments goal not met  Hemodialysis Catheter  Right Internal jugular Double lumen Permanent (Tunneled)  Placement Date/Time: 11/27/23 1409   Serial / Lot #: [id number]  Expiration Date: 06/21/28  Time Out: Correct patient;Correct site;Correct procedure  Maximum sterile barrier precautions: Hand hygiene;Large sterile sheet;Cap;Sterile probe cover;Sterile ...  Site Condition No complications  Blue Lumen Status Flushed;Heparin  locked;Dead end cap in place  Red Lumen Status Flushed;Dead end cap in place;Heparin  locked  Purple Lumen Status N/A  Catheter fill solution Heparin  1000 units/ml  Catheter fill volume (Arterial) 1.6 cc  Catheter fill volume (Venous) 1.6      Lorrene KANDICE [facility]

## 2024-02-21 NOTE — ED Triage Notes (Signed)
 Pt bib [facility] for weakness. Pt has not been to dialysis x1 week d/t the ice on the roads. Suspected UTI as well.

## 2024-02-21 NOTE — ED Provider Notes (Signed)
 " [place] EMERGENCY DEPARTMENT AT [facility] Provider Note   CSN: [id number] Arrival date & time: 02/21/24  1012     Patient presents with: Weakness   Tammy Dyer is a 89 y.o. female presenting for evaluation of generalized weakness which has been slowly worsening over the past week.  Due to the icy road conditions she has missed her last 2 dialysis treatments, Tuesday and Thursday, her last treatment was obtained 7 days ago on Saturday.  She denies increased shortness of breath although reports short of breath that baseline, no peripheral edema, no orthopnea.  She also denies fevers or chills.  Although she denies dysuria she is concerned about possibility of UTI which has caused weakness in the past.  She has been not taking her morning medicines nor she had any p.o. intake today.   The history is provided by the patient and a relative.       Prior to Admission medications  Medication Sig Start Date End Date Taking? Authorizing Provider  acetaminophen  (TYLENOL ) 500 MG tablet Take 1,000 mg by mouth every 6 (six) hours as needed for mild pain or moderate pain.    [provider]  amLODipine  (NORVASC ) 5 MG tablet Take 5 mg by mouth daily. 06/11/23   [provider]  atorvastatin  (LIPITOR) 20 MG tablet Take 20 mg by mouth every evening. 03/24/11   [provider]  colestipol (COLESTID) 5 g packet Take 1 g by mouth daily.    [provider]  EMBECTA PEN NEEDLE NANO 2 GEN 32G X 4 MM MISC daily. 08/27/23   [provider]  Ergocalciferol (VITAMIN D2) 50 MCG (2000 UT) TABS Take 1 tablet by mouth daily.    [provider]  furosemide  (LASIX ) 40 MG tablet Take 40 mg by mouth every morning. 11/21/22   [provider]  hydrALAZINE  (APRESOLINE ) 100 MG tablet Take 100 mg by mouth 3 (three) times daily. 04/22/20   [provider]  LANTUS SOLOSTAR 100 UNIT/ML Solostar Pen Inject 20 Units into the skin every evening.  11/20/19   [provider]  Multiple Vitamins-Minerals (PRESERVISION AREDS 2+MULTI VIT PO) Take 1 capsule by mouth in the morning and at bedtime.    [provider]  omeprazole  (PRILOSEC) 20 MG capsule Take 1 capsule (20 mg total) by mouth daily. 04/19/15   Rourk, Lamar HERO, MD  sertraline  (ZOLOFT ) 100 MG tablet Take 100 mg by mouth daily. 03/31/11   [provider]  spironolactone  (ALDACTONE ) 25 MG tablet Take 12.5 mg by mouth daily. 08/28/23   [provider]  trimethoprim (TRIMPEX) 100 MG tablet Take 100 mg by mouth daily.  03/27/14   [provider]    Allergies: Codeine and Sulfonamide derivatives    Review of Systems  Constitutional:  Negative for chills and fever.  HENT:  Negative for congestion.   Eyes: Negative.   Respiratory:  Negative for chest tightness and shortness of breath.   Cardiovascular:  Negative for chest pain.  Gastrointestinal:  Negative for abdominal pain, nausea and vomiting.  Genitourinary: Negative.   Musculoskeletal:  Negative for arthralgias, joint swelling and neck pain.  Skin: Negative.  Negative for rash and wound.  Neurological:  Positive for weakness. Negative for dizziness, light-headedness, numbness and headaches.  Psychiatric/Behavioral: Negative.      Updated Vital Signs BP (!) 200/67   Pulse 87   Temp 99.1 F (37.3 C) (Axillary)   Resp 18   Ht 5' (1.524 m)  Wt 61 kg   SpO2 96%   BMI 26.26 kg/m   Physical Exam Vitals and nursing note reviewed.  Constitutional:      Appearance: She is well-developed.  HENT:     Head: Normocephalic and atraumatic.     Mouth/Throat:     Mouth: Mucous membranes are dry.  Eyes:     Conjunctiva/sclera: Conjunctivae normal.  Cardiovascular:     Rate and Rhythm: Normal rate and regular rhythm.     Heart sounds: Normal heart sounds.  Pulmonary:     Effort: Pulmonary effort is normal.     Breath sounds: Normal breath sounds. No wheezing.     Comments:  catheter  right upper chest Abdominal:     General: Bowel sounds are normal.     Palpations: Abdomen is soft.     Tenderness: There is no abdominal tenderness.  Musculoskeletal:        General: Normal range of motion.     Cervical back: Normal range of motion.  Skin:    General: Skin is warm and dry.  Neurological:     Mental Status: She is alert.     (all labs ordered are listed, but only abnormal results are displayed) Labs Reviewed  CBC WITH DIFFERENTIAL/PLATELET - Abnormal; Notable for the following components:      Result Value   RBC 3.37 (*)    Hemoglobin 10.6 (*)    HCT 33.0 (*)    RDW 15.9 (*)    All other components within normal limits  URINALYSIS, ROUTINE W REFLEX MICROSCOPIC - Abnormal; Notable for the following components:   Glucose, UA >=500 (*)    Protein, ur >=300 (*)    All other components within normal limits  COMPREHENSIVE METABOLIC PANEL WITH GFR - Abnormal; Notable for the following components:   Glucose, Bld 192 (*)    BUN 74 (*)    Creatinine, Ser 5.67 (*)    Alkaline Phosphatase 128 (*)    GFR, Estimated 7 (*)    Anion gap 18 (*)    All other components within normal limits  CBG MONITORING, ED - Abnormal; Notable for the following components:   Glucose-Capillary 187 (*)    All other components within normal limits  HEPATITIS B SURFACE ANTIGEN  HEPATITIS B SURFACE ANTIBODY, QUANTITATIVE  CBG MONITORING, ED    EKG: EKG Interpretation Date/Time:  Friday February 21 2024 10:52:34 EST Ventricular Rate:  88 PR Interval:    QRS Duration:  166 QT Interval:  451 QTC Calculation: 543 R Axis:   -54  Text Interpretation: Atrial fibrillation Left bundle branch block Interpretation limited secondary to artifact Confirmed by Elnor Savant ([id number]) on 02/21/2024 4:34:55 PM  Radiology: [facility] Chest Port 1 View Result Date: 02/21/2024 CLINICAL DATA:  Weak. EXAM: PORTABLE CHEST 1 VIEW COMPARISON:  02/07/2016 FINDINGS: Right jugular dialysis catheter with the tip in the SVC  region. Lungs are clear without focal airspace disease or pulmonary edema. Heart size is within normal limits. Atherosclerotic calcifications at the aortic arch. Negative for a pneumothorax. Chronic calcifications at the left lung apex. IMPRESSION: 1. No acute cardiopulmonary disease. 2. Right jugular dialysis catheter. Electronically Signed   By: Juliene Balder M.D.   On: 02/21/2024 11:50     Procedures   Medications Ordered in the ED  Chlorhexidine  Gluconate Cloth 2 % PADS 6 each (has no administration in time range)  furosemide  (LASIX ) tablet 40 mg (40 mg Oral Given 02/21/24 1222)  spironolactone  (ALDACTONE ) tablet 12.5 mg (12.5  mg Oral Given 02/21/24 1222)  amLODipine  (NORVASC ) tablet 5 mg (5 mg Oral Given 02/21/24 1222)  acetaminophen  (TYLENOL ) tablet 650 mg (650 mg Oral Given 02/21/24 1502)                                    Medical Decision Making Patient presenting with generalized weakness, having missed her dialysis both Tuesday and yesterday secondary to inclement weather.  She is not fluid overloaded clinically and her labs do not suggest emergent need for dialysis as her potassium level is normal range at 3.9.  However with the weather event that we will start today, she will have no ability to go to dialysis tomorrow either, meaning it will be another 4 days before she has opportunity for dialysis again.  I spoke to her primary nephrologist Dr. Rachele who recommends she get admitted for dialysis treatment.  Also called Dr. Melia with nephrology who agrees and will place orders for patient to have dialysis before she leaves here today.  She should not need an overnight stay.  Will probably be around 7 PM before her treatment is started as there are several patients ahead of her waiting for their dialysis as well.  Amount and/or Complexity of Data Reviewed Labs: ordered.    Details: Labs are stable, CMET is significant for BUN of 74 and a creatinine of 5.67 her glucose is 192, potassium 3.9  alk phos 128 hemoglobin is 10.6 which is better than recent hemoglobins, urinalysis greater than 500 glucose and greater than 300 protein. Radiology: ordered.    Details: No acute cardiopulmonary findings. ECG/medicine tests: ordered. Discussion of management or test interpretation with external provider(s): Per above, discussed with nephrology, Dr. Melia is arranging dialysis for patient this evening after which she can be dispo home.  Risk OTC drugs. Prescription drug management. Decision regarding hospitalization.        Final diagnoses:  Weakness  Dialysis patient, noncompliant    ED Discharge Orders     None          Birdena Mliss RIGGERS 02/21/24 1636  "

## 2024-02-21 NOTE — Progress Notes (Signed)
 Rapid Response Event Note   Reason for Call :  Patient in dialysis where she had reported hypotensive episode, feeling nauseated, and decreased LOC.  Initial Focused Assessment:  Upon entering room dialysis RN informed this RN that about episode and reports giving bolus for BP.  RT, ED MD Elnor (as patient is ED patient and not admitted), [facility] Supervisor, and ICU RN all responded. Initial BP was 88/37 with HR 114 99% RA. Pt became more alert and speaking with staff. BP 148/80 99%RA, 99 HR.   Plan of Care:  Dialysis RN reached out to nephrology for orders.   Event Summary:   MD Notified: 55 - this RN called ED MD to come to dialysis room as nephrology is not in the building while dialysis RN paging for nephrology. Call Time: 1017 Arrival Time: 1017 End Time: 1045  Warren GORMAN Benders, RN

## 2024-02-22 LAB — HEPATITIS B SURFACE ANTIBODY, QUANTITATIVE: Hep B S AB Quant (Post): <3.5 m[IU]/mL — ABNORMAL LOW

## 2024-02-22 LAB — HEPATITIS B SURFACE ANTIGEN: Hepatitis B Surface Ag: REACTIVE — AB

## 2024-02-22 NOTE — ED Provider Notes (Signed)
" °  Physical Exam  BP (!) 190/62 (BP Location: Right Arm)   Pulse 84   Temp 98.3 F (36.8 C) (Oral)   Resp 16   Ht 5' (1.524 m)   Wt 61 kg   SpO2 98%   BMI 26.26 kg/m   Physical Exam Vitals and nursing note reviewed.  Pulmonary:     Effort: Pulmonary effort is normal.  Skin:    General: Skin is warm and dry.  Neurological:     Mental Status: She is alert and oriented to person, place, and time.     Procedures  Procedures  ED Course / MDM    Medical Decision Making Amount and/or Complexity of Data Reviewed Labs: ordered. Radiology: ordered. ECG/medicine tests: ordered.  Risk OTC drugs. Prescription drug management.   Care assumed from evening shift.  Patient undergoing dialysis and has returned to the ER.  She is now feeling better.  Her vital signs are stable and she is well-appearing.  I feel as though she can safely be discharged.  Nurse to call the son and inform him of her pending discharge.       Geroldine Berg, MD 02/22/24 0020  "

## 2024-02-22 NOTE — Discharge Instructions (Signed)
 Continue your dialysis as previously scheduled.  Continue medications as previously prescribed.

## 2024-03-02 ENCOUNTER — Encounter (INDEPENDENT_AMBULATORY_CARE_PROVIDER_SITE_OTHER): Admitting: Ophthalmology

## 2024-09-09 ENCOUNTER — Encounter (INDEPENDENT_AMBULATORY_CARE_PROVIDER_SITE_OTHER): Admitting: Ophthalmology
